# Patient Record
Sex: Male | Born: 1965
Health system: Southern US, Community
[De-identification: ages and names within clinical notes are randomized; demographics above are authoritative.]

## PROBLEM LIST (undated history)

## (undated) DIAGNOSIS — Z720 Tobacco use: Secondary | ICD-10-CM

## (undated) DIAGNOSIS — G44209 Tension-type headache, unspecified, not intractable: Secondary | ICD-10-CM

## (undated) DIAGNOSIS — I10 Essential (primary) hypertension: Secondary | ICD-10-CM

## (undated) DIAGNOSIS — E785 Hyperlipidemia, unspecified: Secondary | ICD-10-CM

## (undated) HISTORY — DX: Hyperlipidemia, unspecified: E78.5

## (undated) HISTORY — DX: Tension-type headache, unspecified, not intractable: G44.209

## (undated) HISTORY — DX: Tobacco use: Z72.0

---

## 1999-02-20 ENCOUNTER — Ambulatory Visit (HOSPITAL_COMMUNITY): Admission: RE | Admit: 1999-02-20 | Discharge: 1999-02-20 | Payer: Self-pay | Admitting: Family Medicine

## 1999-02-20 ENCOUNTER — Encounter: Payer: Self-pay | Admitting: Family Medicine

## 2001-12-07 ENCOUNTER — Other Ambulatory Visit: Admission: RE | Admit: 2001-12-07 | Discharge: 2001-12-07 | Payer: Self-pay | Admitting: Radiology

## 2008-12-26 ENCOUNTER — Encounter: Admission: RE | Admit: 2008-12-26 | Discharge: 2008-12-26 | Payer: Self-pay | Admitting: Family Medicine

## 2012-10-26 ENCOUNTER — Emergency Department (HOSPITAL_COMMUNITY)
Admission: EM | Admit: 2012-10-26 | Discharge: 2012-10-26 | Disposition: A | Discharge status: Home / Self Care | Payer: Managed Care, Other (non HMO) | Source: Home / Self Care | Attending: Emergency Medicine | Admitting: Emergency Medicine

## 2012-10-26 ENCOUNTER — Encounter (HOSPITAL_COMMUNITY): Payer: Self-pay | Admitting: Emergency Medicine

## 2012-10-26 DIAGNOSIS — K047 Periapical abscess without sinus: Secondary | ICD-10-CM

## 2012-10-26 MED ORDER — METRONIDAZOLE 500 MG PO TABS: 500.0000 mg | ORAL_TABLET | Freq: Three times a day (TID) | ORAL | Status: DC

## 2012-10-26 MED ORDER — PENICILLIN V POTASSIUM 500 MG PO TABS: 500.0000 mg | ORAL_TABLET | Freq: Four times a day (QID) | ORAL | Status: DC

## 2012-10-26 MED ORDER — HYDROCODONE-ACETAMINOPHEN 5-325 MG PO TABS: ORAL_TABLET | ORAL | Status: DC

## 2012-10-26 MED ORDER — MELOXICAM 15 MG PO TABS: 15.0000 mg | ORAL_TABLET | Freq: Every day | ORAL | Status: DC

## 2012-10-26 NOTE — ED Notes (Signed)
Pt is c/o of left facial pain that starts at cheek down to lower jaw line x 2 to 3 days.   Pt thinks that he has an abscess. Pt states that he has been some fatigue.  Denies any other symptom.  Using advil for pain relief.

## 2012-10-26 NOTE — ED Provider Notes (Signed)
Chief Complaint  Patient presents with  . Facial Pain    left facial pain x 2 days pt thinks he has an abscess. swelling starts at cheek to lower jaw.    History of Present Illness:   The patient is a 46 year old male who presents with a three-day history of left maxillary pain and swelling the pain seems to be arising from his first molar on the left side. He denies any injury. He's had some headaches and feels tired and rundown. He denies any fever, chills, sweats, ocular symptoms. He's had no nasal congestion, rhinorrhea, or earache. The pain is worse if she chews on that side but he has no difficulty with swallowing or breathing. He denies any swelling of the neck or pain and has had no coughing, wheezing, chest pain, or shortness of breath.  Review of Systems:  Other than noted above, the patient denies any of the following symptoms: Systemic:  No fever, chills, sweats or weight loss. ENT:  No headache, ear ache, sore throat, nasal congestion, facial pain, or swelling. Lymphatic:  No adenopathy. Lungs:  No coughing, wheezing or shortness of breath.  PMFSH:  Past medical history, family history, social history, meds, and allergies were reviewed.  Physical Exam:   Vital signs:  BP 126/92  Pulse 77  Temp 98.6 F (37 C) (Oral)  Resp 16  SpO2 99% General:  Alert, oriented, in no distress. ENT:  TMs and canals normal.  Nasal mucosa normal. Mouth exam:  He has multiple teeth that are missing, however the teeth that he does have all looked to be in good repair. The upper left first molar appears normal but it's tender to touch. There is no swelling of the gingiva. No collection of pus, the pharynx is clear, there is no swelling of the floor the mouth. Neck:  No swelling or adenopathy. Lungs:  Breath sounds clear and equal bilaterally.  No wheezes, rales or rhonchi. Heart:  Regular rhythm.  No gallops or murmers. Skin:  Clear, warm and dry.  Assessment:  The encounter diagnosis was Dental  abscess.  Plan:   1.  The following meds were prescribed:   New Prescriptions   HYDROCODONE-ACETAMINOPHEN (NORCO/VICODIN) 5-325 MG PER TABLET    1 to 2 tabs every 4 to 6 hours as needed for pain.   MELOXICAM (MOBIC) 15 MG TABLET    Take 1 tablet (15 mg total) by mouth daily.   METRONIDAZOLE (FLAGYL) 500 MG TABLET    Take 1 tablet (500 mg total) by mouth 3 (three) times daily.   PENICILLIN V POTASSIUM (VEETID) 500 MG TABLET    Take 1 tablet (500 mg total) by mouth 4 (four) times daily.   2.  The patient was instructed in symptomatic care and handouts were given. 3.  The patient was told to return if becoming worse in any way, if no better in 3 or 4 days, and given some red flag symptoms that would indicate earlier return, especially difficulty breathing. 4.  The patient was told to follow up with a dentist as soon as possible.    Reuben Likes, MD 10/26/12 1340

## 2015-04-04 ENCOUNTER — Emergency Department (HOSPITAL_COMMUNITY)
Admission: EM | Admit: 2015-04-04 | Discharge: 2015-04-04 | Disposition: A | Discharge status: Home / Self Care | Payer: BLUE CROSS/BLUE SHIELD | Source: Home / Self Care | Attending: Family Medicine | Admitting: Family Medicine

## 2015-04-04 ENCOUNTER — Encounter (HOSPITAL_COMMUNITY): Payer: Self-pay

## 2015-04-04 DIAGNOSIS — R42 Dizziness and giddiness: Secondary | ICD-10-CM

## 2015-04-04 DIAGNOSIS — I10 Essential (primary) hypertension: Secondary | ICD-10-CM | POA: Diagnosis not present

## 2015-04-04 LAB — POCT I-STAT, CHEM 8
BUN: 14 mg/dL (ref 6–23)
Calcium, Ion: 1.24 mmol/L — ABNORMAL HIGH (ref 1.12–1.23)
Chloride: 101 mmol/L (ref 96–112)
Creatinine, Ser: 1 mg/dL (ref 0.50–1.35)
Glucose, Bld: 129 mg/dL — ABNORMAL HIGH (ref 70–99)
HCT: 54 % — ABNORMAL HIGH (ref 39.0–52.0)
Hemoglobin: 18.4 g/dL — ABNORMAL HIGH (ref 13.0–17.0)
Potassium: 3.6 mmol/L (ref 3.5–5.1)
Sodium: 142 mmol/L (ref 135–145)
TCO2: 26 mmol/L (ref 0–100)

## 2015-04-04 MED ORDER — LISINOPRIL 10 MG PO TABS: 10.0000 mg | ORAL_TABLET | Freq: Every day | ORAL | Status: DC

## 2015-04-04 NOTE — Discharge Instructions (Signed)
Thank you for coming in today. Go to the emergency room if your headache becomes excruciating or you have weakness or numbness or uncontrolled vomiting.  Follow up with a primary doctor soon.  Start lisinopril daily for blood pressure.  Go to the ER if you get worse.   PRIMARY CARE Merchant navy officer at [employer] [address]  [place], [place] [phone]  Fax [phone]  Nature conservation officer at [facility] [address]  [place], [place] [phone]  Fax [phone]  Nature conservation officer at [place] / [place] [phone] [address] [place], [place] [phone]  Fax [phone]  [facility] [address]  [place], [place] [phone]  Fax [phone]  [facility] [address] [place] [address] [place], [place] [phone]  Fax [phone]  [facility] [address] [place], [place] [phone]  Fax [phone]   [facility] @ [place] [address] [place] [address] Phone: [phone]   [facility] @ [facility] [address] [place] [address] Phone: [phone]   [facility] Medicine @ [place] [address] [place] [address] Phone: [phone]   [facility] Medicine @ [place] [address] [place] [address] Phone: [phone]   [facility] @ [place] [address] [place] [address] Phone: [phone] Fax: [phone]   [facility]icians @ [place] [address] [place] [address] Phone: [phone]   Dr. Maryelizabeth Rowan [address] [place] [address] [phone]   Dizziness  Dizziness means you feel unsteady or lightheaded. You might feel like you are going to pass out (faint). HOME CARE    Drink enough fluids to keep your pee (urine) clear or pale yellow.  Take your medicines exactly as told by your doctor. If you take blood pressure medicine, always stand up slowly from the lying or sitting position. Hold on to something to steady yourself.  If you need to stand in one place for a long time, move your legs often. Tighten and relax your leg muscles.  Have someone stay with you until you feel okay.  Do not drive or use heavy machinery if you feel dizzy.  Do not drink alcohol. GET HELP RIGHT AWAY IF:   You feel dizzy or lightheaded and it gets worse.  You feel sick to your stomach (nauseous), or you throw up (vomit).  You have trouble talking or walking.  You feel weak or have trouble using your arms, hands, or legs.  You cannot think clearly or have trouble forming sentences.  You have chest pain, belly (abdominal) pain, sweating, or you are short of breath.  Your vision changes.  You are bleeding.  You have problems from your medicine that seem to be getting worse. MAKE SURE YOU:   Understand these instructions.  Will watch your condition.  Will get help right away if you are not doing well or get worse. Document Released: 12/02/2011 Document Revised: 03/06/2012 Document Reviewed: 12/02/2011 [facility] Patient Information 2015 [place], [place]. This information is not intended to replace advice given to you by your health care provider. Make sure you discuss any questions you have with your health care provider.  Hypertension Hypertension is  another name for high blood pressure. High blood pressure forces your heart to work harder to pump blood. A blood pressure reading has two numbers, which includes a higher number over a lower number (example: 110/72). HOME CARE   Have your blood pressure rechecked by your doctor.  Only take medicine as told by your doctor. Follow the directions carefully. The medicine does not work as well if you skip doses. Skipping  doses also puts you at risk for problems.  Do not smoke.  Monitor your blood pressure at home as told by your doctor. GET HELP IF:  You think you are having a reaction to the medicine you are taking.  You have repeat headaches or feel dizzy.  You have puffiness (swelling) in your ankles.  You have trouble with your vision. GET HELP RIGHT AWAY IF:   You get a very bad headache and are confused.  You feel weak, numb, or faint.  You get chest or belly (abdominal) pain.  You throw up (vomit).  You cannot breathe very well. MAKE SURE YOU:   Understand these instructions.  Will watch your condition.  Will get help right away if you are not doing well or get worse. Document Released: 05/31/2008 Document Revised: 12/18/2013 Document Reviewed: 10/05/2013 [facility] Patient Information 2015 [place]. This information is not intended to replace advice given to you by your health care provider. Make sure you discuss any questions you have with your health care provider.

## 2015-04-04 NOTE — ED Notes (Signed)
C/o 1 month duration of HA, dizzy . Pain better w tylenol

## 2015-04-04 NOTE — ED Provider Notes (Signed)
Zeb Katha Cabaldrong is a 49 y.o. male who presents to Urgent Care today for dizziness. Patient describes vertigo for the last month. The vertigo tends to be worse with head motion. He also notes a mild headache. He denies any vision changes weakness or numbness or falls. No fevers or chills vomiting or diarrhea. He's tried some Tylenol which helps some. He feels well otherwise.   History reviewed. No pertinent past medical history. History reviewed. No pertinent past surgical history. History  Substance Use Topics  . Smoking status: Current Every Day Smoker -- 1.00 packs/day    Types: Cigarettes  . Smokeless tobacco: Not on file  . Alcohol Use: Yes     Comment: occasional   ROS as above Medications: No current facility-administered medications for this encounter.   Current Outpatient Prescriptions  Medication Sig Dispense Refill  . HYDROcodone-acetaminophen (NORCO/VICODIN) 5-325 MG per tablet 1 to 2 tabs every 4 to 6 hours as needed for pain. 20 tablet 0  . meloxicam (MOBIC) 15 MG tablet Take 1 tablet (15 mg total) by mouth daily. 15 tablet 0  . metroNIDAZOLE (FLAGYL) 500 MG tablet Take 1 tablet (500 mg total) by mouth 3 (three) times daily. 30 tablet 0  . penicillin v potassium (VEETID) 500 MG tablet Take 1 tablet (500 mg total) by mouth 4 (four) times daily. 40 tablet 0   No Known Allergies   Exam:  BP 151/94 mmHg  Pulse 63  Temp(Src) 98.2 F (36.8 C) (Oral)  Resp 14  SpO2 98% Gen: Well NAD HEENT: EOMI,  MMM PERRLA Lungs: Normal work of breathing. CTABL Heart: RRR no MRG Abd: NABS, Soft. Nondistended, Nontender Exts: Brisk capillary refill, warm and well perfused.  Neuro: Alert and oriented cranial nerves II through XII are intact normal coordination reflexes sensation strength and balance and gait. Positive left sided [employer] Hallpike test.  Results for orders placed or performed during the hospital encounter of 04/04/15 (from the past 24 hour(s))  I-STAT, chem 8     Status:  Abnormal   Collection Time: 04/04/15 10:02 AM  Result Value Ref Range   Sodium 142 135 - 145 mmol/L   Potassium 3.6 3.5 - 5.1 mmol/L   Chloride 101 96 - 112 mmol/L   BUN 14 6 - 23 mg/dL   Creatinine, Ser 4.091.00 0.50 - 1.35 mg/dL   Glucose, Bld 811129 (H) 70 - 99 mg/dL   Calcium, Ion 9.141.24 (H) 1.12 - 1.23 mmol/L   TCO2 26 0 - 100 mmol/L   Hemoglobin 18.4 (H) 13.0 - 17.0 g/dL   HCT 78.254.0 (H) 95.639.0 - 21.352.0 %   No results found.  Assessment and Plan: 49 y.o. male with dizziness. Patient has vague vertigo symptoms. These have been ongoing but not worsening for the past month. He has a positive left-sided [employer] Hallpike test. I suspect he probably has BPPV. However he would require any further workup. He does not appear to be acutely ill. I recommend that he follow-up with a primary care provider for this further workup as needed.  Additionally he has elevated blood pressure. This does not appear to be controlled at all. Plan to treat with lisinopril and follow-up with her primary care provider again. I have provided an extensive list of primary care providers.  Discussed warning signs or symptoms. Please see discharge instructions. Patient expresses understanding.     Rodolph BongEvan S Corey, MD 04/04/15 1021

## 2016-03-26 ENCOUNTER — Emergency Department (INDEPENDENT_AMBULATORY_CARE_PROVIDER_SITE_OTHER)
Admission: EM | Admit: 2016-03-26 | Discharge: 2016-03-26 | Disposition: A | Discharge status: Home / Self Care | Payer: BLUE CROSS/BLUE SHIELD | Source: Home / Self Care | Attending: Family Medicine | Admitting: Family Medicine

## 2016-03-26 ENCOUNTER — Encounter (HOSPITAL_COMMUNITY): Payer: Self-pay | Admitting: Family Medicine

## 2016-03-26 DIAGNOSIS — I10 Essential (primary) hypertension: Secondary | ICD-10-CM

## 2016-03-26 DIAGNOSIS — L309 Dermatitis, unspecified: Secondary | ICD-10-CM

## 2016-03-26 DIAGNOSIS — H9313 Tinnitus, bilateral: Secondary | ICD-10-CM

## 2016-03-26 HISTORY — DX: Essential (primary) hypertension: I10

## 2016-03-26 MED ORDER — DEXAMETHASONE 4 MG PO TABS
10.0000 mg | ORAL_TABLET | Freq: Once | ORAL | Status: AC
  Administered 2016-03-26: 10 mg via ORAL

## 2016-03-26 MED ORDER — TRIAMCINOLONE ACETONIDE 0.025 % EX OINT: 1.0000 "application " | TOPICAL_OINTMENT | Freq: Two times a day (BID) | CUTANEOUS | Status: DC

## 2016-03-26 MED ORDER — DEXAMETHASONE 2 MG PO TABS
ORAL_TABLET | ORAL | Status: AC
  Filled 2016-03-26: qty 1

## 2016-03-26 MED ORDER — DEXAMETHASONE 4 MG PO TABS
ORAL_TABLET | ORAL | Status: AC
  Filled 2016-03-26: qty 2

## 2016-03-26 MED ORDER — FLUTICASONE PROPIONATE 50 MCG/ACT NA SUSP: 2.0000 | Freq: Every day | NASAL | Status: DC

## 2016-03-26 NOTE — ED Notes (Signed)
Pt  Reports  Symptoms  Of  A  Headache  As  Well as  Being  Dizzy  With  Onset   X  sev  Days      Pt  Has a  History    Of  Headache     About  1  Year  Ago

## 2016-03-26 NOTE — Discharge Instructions (Signed)
The Tinnitus, or ear ringing, is likely a natural process of aging or it may be due to eustachian tube dysfunction due to allergies. Please do the following: - start flonase every night - start a daily allergy pill like cetrizine - use the ointment as prescribed - use a fragrance free lotion such as Aveeno daily. - start ibuprofen 600 miligrams for headache   Please follow up with our social worker [phone]

## 2016-03-26 NOTE — ED Provider Notes (Signed)
CSN: [id number]     Arrival date & time 03/26/16  1634 History   First MD Initiated Contact with Patient 03/26/16 1820     Chief Complaint  Patient presents with  . Headache   (Consider location/radiation/quality/duration/timing/severity/associated sxs/prior Treatment) HPI  Ringing in ears: L>R. Ongoing 2-3 years ago. Denies dizzines sand HA, LOC. Nothing makes it better. Nothing makes it worse. No specific triggers. Not associated with chest pain or palpitations or high blood pressure. Patient does endorse having allergies on a regular basis which include sinus congestion..   Itching legs. Worse in the cold. Now skin turning dark. OTC cream w/o benefit.       Past Medical History  Diagnosis Date  . Hypertension    History reviewed. No pertinent past surgical history. Family History  Problem Relation Age of Onset  . Family history unknown: Yes   Social History  Substance Use Topics  . Smoking status: Current Every Day Smoker -- 1.00 packs/day    Types: Cigarettes  . Smokeless tobacco: None  . Alcohol Use: Yes     Comment: occasional    Review of Systems Per HPI with all other pertinent systems negative.   Allergies  Review of patient's allergies indicates no known allergies.  Home Medications   Prior to Admission medications   Medication Sig Start Date End Date Taking? Authorizing Provider  fluticasone (FLONASE) 50 MCG/ACT nasal spray Place 2 sprays into both nostrils at bedtime. 03/26/16   Ozella Rocksavid J Merrell, MD  lisinopril (PRINIVIL,ZESTRIL) 10 MG tablet Take 1 tablet (10 mg total) by mouth daily. 04/04/15   Rodolph BongEvan S Corey, MD  triamcinolone (KENALOG) 0.025 % ointment Apply 1 application topically 2 (two) times daily. Stop after 1 week. May repeat in 2 weeks 03/26/16   Ozella Rocksavid J Merrell, MD   Meds Ordered and Administered this Visit   Medications  dexamethasone (DECADRON) tablet 10 mg (not administered)    BP 124/85 mmHg  Pulse 74  Temp(Src) 98.2 F (36.8 C) (Oral)   Resp 14  SpO2 99% No data found.   Physical Exam Physical Exam  Constitutional: oriented to person, place, and time. appears well-developed and well-nourished. No distress.  HENT:  TMs normal bilaterally Head: Normocephalic and atraumatic.  Eyes: EOMI. PERRL.  Neck: Normal range of motion.  Cardiovascular: RRR, no m/r/g, 2+ distal pulses,  Pulmonary/Chest: Effort normal and breath sounds normal. No respiratory distress.  Abdominal: Soft. Bowel sounds are normal. NonTTP, no distension.  Musculoskeletal: Normal range of motion. Non ttp, no effusion.  Neurological: alert and oriented to person, place, and time.  Skin: Various areas of dark scaly skin over legs and into cubital fossa was.  Psychiatric: normal mood and affect. behavior is normal. Judgment and thought content normal.   ED Course  Procedures (including critical care time)  Labs Review Labs Reviewed - No data to display  Imaging Review No results found.   Visual Acuity Review  Right Eye Distance:   Left Eye Distance:   Bilateral Distance:    Right Eye Near:   Left Eye Near:    Bilateral Near:         MDM   1. Tinnitus aurium, bilateral   2. Eczema   3. Essential hypertension    Patient started on lisinopril due to hypertension. Patient to follow-up with primary care physician. Patient no longer hypertensive at this time. No need to refill lisinopril.  Tinnitus likely from age-related degenerative changes in the middle ear versus eustachian tube dysfunction from  chronic allergies. Decadron 10 mg by mouth given. Patient start Flonase. NSAID to be used emesis make symptoms worse in which case he should stop. Follow-up with PCP.  Patient with eczematous type changes on his legs. This seems to be seasonal with worsening in the winters. Patient to start Kenalog ointment as well as topical hypoallergenic lotions. He should be aware of risks of using this ointment as a contemporary lighting and permanently thin  the skin.    Ozella Rocks, MD 03/26/16 [phone]

## 2018-05-17 ENCOUNTER — Encounter (HOSPITAL_COMMUNITY): Payer: Self-pay | Admitting: Emergency Medicine

## 2018-05-17 ENCOUNTER — Ambulatory Visit (HOSPITAL_COMMUNITY)
Admission: EM | Admit: 2018-05-17 | Discharge: 2018-05-17 | Disposition: A | Discharge status: Home / Self Care | Payer: BLUE CROSS/BLUE SHIELD | Attending: Family Medicine | Admitting: Family Medicine

## 2018-05-17 DIAGNOSIS — M62838 Other muscle spasm: Secondary | ICD-10-CM | POA: Diagnosis not present

## 2018-05-17 MED ORDER — CYCLOBENZAPRINE HCL 10 MG PO TABS: 10.0000 mg | ORAL_TABLET | Freq: Every day | ORAL | 0 refills | Status: DC

## 2018-05-17 MED ORDER — DICLOFENAC SODIUM 75 MG PO TBEC: 75.0000 mg | DELAYED_RELEASE_TABLET | Freq: Two times a day (BID) | ORAL | 0 refills | Status: DC

## 2018-05-17 NOTE — ED Triage Notes (Signed)
Pt c/o L shoulder pain, started last night. Painful with moving.

## 2018-05-24 NOTE — ED Provider Notes (Signed)
[facility]   [id number] 05/17/18 Arrival Time: 1305  ASSESSMENT & PLAN:  1. Muscle spasm    Meds ordered this encounter  Medications  . cyclobenzaprine (FLEXERIL) 10 MG tablet    Sig: Take 1 tablet (10 mg total) by mouth at bedtime. As needed for muscle spasm.    Dispense:  10 tablet    Refill:  0  . diclofenac (VOLTAREN) 75 MG EC tablet    Sig: Take 1 tablet (75 mg total) by mouth 2 (two) times daily.    Dispense:  14 tablet    Refill:  0   Medication sedation precautions. Encouraged ROM as tolerated. Follow-up Information    [facility].   Specialty:  Urgent Care Why:  If symptoms worsen. Contact information: [address] [place] [address] [phone]         Reviewed expectations re: course of current medical issues. Questions answered. Outlined signs and symptoms indicating need for more acute intervention. Patient verbalized understanding. After Visit Summary given.  SUBJECTIVE: History from: patient. David Shepherd is a 52 y.o. male who reports intermittent mild pain of his left shoulder and upper back discomfort that is stable; described as aching and cramping without radiation. Onset: gradual, 1-2 days ago; noticed more last evening Injury/trama: no. Relieved by: rest. Worsened by: certain movements. Associated symptoms: none reported. Extremity sensation changes or weakness: none. Self treatment: has not tried OTCs for relief of pain. History of similar: no  ROS: As per HPI.   OBJECTIVE:  Vitals:   05/17/18 1346  BP: 137/79  Pulse: 66  Resp: 18  Temp: 97.6 F (36.4 C)  SpO2: 100%    General appearance: alert; no distress Extremities: warm and well perfused; symmetrical with no gross deformities; poorly localized tenderness over his left shoulder area along trapezius distribution with no swelling and no bruising; ROM: normal but with reported discomfort CV: normal extremity  capillary refill Skin: warm and dry Neurologic: normal gait; normal symmetric reflexes in all extremities; normal sensation in all extremities Psychological: alert and cooperative; normal mood and affect  No Known Allergies  Past Medical History:  Diagnosis Date  . Hypertension    Social History   Socioeconomic History  . Marital status: Single    Spouse name: Not on file  . Number of children: Not on file  . Years of education: Not on file  . Highest education level: Not on file  Occupational History  . Not on file  Social Needs  . Financial resource strain: Not on file  . Food insecurity:    Worry: Not on file    Inability: Not on file  . Transportation needs:    Medical: Not on file    Non-medical: Not on file  Tobacco Use  . Smoking status: Current Every Day Smoker    Packs/day: 1.00    Types: Cigarettes  Substance and Sexual Activity  . Alcohol use: Yes    Comment: occasional  . Drug use: Not on file  . Sexual activity: Not on file  Lifestyle  . Physical activity:    Days per week: Not on file    Minutes per session: Not on file  . Stress: Not on file  Relationships  . Social connections:    Talks on phone: Not on file    Gets together: Not on file    Attends religious service: Not on file    Active member of club or organization: Not  on file    Attends meetings of clubs or organizations: Not on file    Relationship status: Not on file  . Intimate partner violence:    Fear of current or ex partner: Not on file    Emotionally abused: Not on file    Physically abused: Not on file    Forced sexual activity: Not on file  Other Topics Concern  . Not on file  Social History Narrative  . Not on file   Family History  Family history unknown: Yes   History reviewed. No pertinent surgical history.    Mardella Layman, MD 05/29/18 [phone]

## 2018-05-25 ENCOUNTER — Emergency Department (HOSPITAL_COMMUNITY): Payer: No Typology Code available for payment source

## 2018-05-25 ENCOUNTER — Other Ambulatory Visit: Payer: Self-pay

## 2018-05-25 ENCOUNTER — Encounter (HOSPITAL_COMMUNITY): Payer: Self-pay | Admitting: Emergency Medicine

## 2018-05-25 ENCOUNTER — Emergency Department (HOSPITAL_COMMUNITY)
Admission: EM | Admit: 2018-05-25 | Discharge: 2018-05-25 | Disposition: A | Discharge status: Home / Self Care | Payer: No Typology Code available for payment source | Attending: Emergency Medicine | Admitting: Emergency Medicine

## 2018-05-25 DIAGNOSIS — M25512 Pain in left shoulder: Secondary | ICD-10-CM | POA: Insufficient documentation

## 2018-05-25 DIAGNOSIS — M542 Cervicalgia: Secondary | ICD-10-CM

## 2018-05-25 DIAGNOSIS — M5412 Radiculopathy, cervical region: Secondary | ICD-10-CM | POA: Diagnosis not present

## 2018-05-25 DIAGNOSIS — F17210 Nicotine dependence, cigarettes, uncomplicated: Secondary | ICD-10-CM | POA: Insufficient documentation

## 2018-05-25 DIAGNOSIS — I10 Essential (primary) hypertension: Secondary | ICD-10-CM | POA: Insufficient documentation

## 2018-05-25 MED ORDER — KETOROLAC TROMETHAMINE 30 MG/ML IJ SOLN
30.0000 mg | Freq: Once | INTRAMUSCULAR | Status: AC
  Administered 2018-05-25: 30 mg via INTRAVENOUS
  Filled 2018-05-25: qty 1

## 2018-05-25 MED ORDER — OXYCODONE-ACETAMINOPHEN 5-325 MG PO TABS
1.0000 | ORAL_TABLET | Freq: Once | ORAL | Status: AC
  Administered 2018-05-25: 1 via ORAL
  Filled 2018-05-25: qty 1

## 2018-05-25 MED ORDER — PREDNISONE 50 MG PO TABS: 50.0000 mg | ORAL_TABLET | Freq: Every day | ORAL | 0 refills | Status: AC

## 2018-05-25 MED ORDER — TRAMADOL HCL 50 MG PO TABS: 50.0000 mg | ORAL_TABLET | Freq: Four times a day (QID) | ORAL | 0 refills | Status: DC | PRN

## 2018-05-25 MED ORDER — METHOCARBAMOL 500 MG PO TABS
1000.0000 mg | ORAL_TABLET | Freq: Once | ORAL | Status: AC
  Administered 2018-05-25: 1000 mg via ORAL
  Filled 2018-05-25: qty 2

## 2018-05-25 MED ORDER — PREDNISONE 20 MG PO TABS
60.0000 mg | ORAL_TABLET | Freq: Once | ORAL | Status: AC
  Administered 2018-05-25: 60 mg via ORAL
  Filled 2018-05-25: qty 3

## 2018-05-25 NOTE — ED Notes (Signed)
Patient transported to X-ray 

## 2018-05-25 NOTE — ED Provider Notes (Signed)
[facility] EMERGENCY DEPARTMENT Provider Note   CSN: [id number] Arrival date & time: 05/25/18  4098     History   Chief Complaint Chief Complaint  Patient presents with  . Shoulder Pain    HPI David Shepherd is a 52 y.o. male.  David Shepherd is a 52 y.o. Male with a history of hypertension, who presents to the ED for evaluation of pain radiating from his neck and left shoulder down through his left arm.  He reports pain started on 5/21 after coming home from work.  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  Patient reports the next day pain was worse and he was seen at urgent care, he was diagnosed with muscle spasm, and treated with diclofenac and Flexeril but patient reports pain has continued to worsen.  He now feels some intermittent tingling and weakness in his left arm.  If he is moving the left arm without difficulty here in the ED.  No redness or swelling over the shoulder.  No fevers or chills.  He denies any new injury, has been continuing to go to work.  He reports pain is a constant dull ache and feels like his muscles are not.  He denies any chest pain or shortness of breath.  Pain is not worse with a exertion or deep breathing.  Patient speaks [place] [employer] interpreter services used, slight difference in dialect, but wife assisted in translation.     Past Medical History:  Diagnosis Date  . Hypertension     There are no active problems to display for this patient.   History reviewed. No pertinent surgical history.      Home Medications    Prior to Admission medications   Medication Sig Start Date End Date Taking? Authorizing Provider  cyclobenzaprine (FLEXERIL) 10 MG tablet Take 1 tablet (10 mg total) by mouth at bedtime. As needed for muscle spasm. 05/17/18   Mardella Layman, MD  diclofenac (VOLTAREN) 75 MG EC tablet Take 1 tablet (75 mg total) by mouth 2 (two) times daily. 05/17/18   Mardella Layman, MD    Family History Family History  Family history unknown: Yes    Social History Social History   Tobacco Use  . Smoking status: Current Every Day Smoker    Packs/day: 1.00    Types: Cigarettes  Substance Use Topics  . Alcohol use: Yes    Comment: occasional  . Drug use: Not on file     Allergies   Patient has no known allergies.   Review of Systems Review of Systems  Constitutional: Negative for chills and fever.  Respiratory: Negative for cough and shortness of breath.   Cardiovascular: Negative for chest pain and leg swelling.  Gastrointestinal: Negative for nausea and vomiting.  Musculoskeletal: Positive for arthralgias. Negative for joint swelling.  Skin: Negative for color change, rash and wound.  Neurological: Positive for weakness. Negative for dizziness, tremors, syncope, light-headedness and numbness.     Physical Exam Updated Vital Signs BP 138/89 (BP Location: Right Arm)   Pulse 90   Temp 97.7 F (36.5 C) (Oral)   Resp 18   Ht  (1.549 m)   Wt 56.2 kg (124 lb)   SpO2 100%   BMI 23.43 kg/m   Physical Exam  Constitutional: He appears well-developed and well-nourished. No distress.  HENT:  Head: Normocephalic and atraumatic.  Eyes: Right eye exhibits no discharge. Left eye exhibits no discharge.  Neck: Normal range of motion. Neck supple.  There is some tenderness primarily over the left paraspinal muscles of the C-spine, no overlying erythema or palpable deformity  Pulmonary/Chest: Effort normal. No respiratory distress.  Musculoskeletal:  Tenderness to palpation over the distribution of the left trapezius muscle, and pain over the left shoulder, primarily over the posterior aspect, no overlying erythema or appreciable swelling, no obvious deformity, able to move the shoulder through full passive range of motion with some discomfort, normal range of motion at the elbow and wrist without pain, no swelling or erythema over the upper or  lower arm, 2+ radial pulse and good capillary refill, cardinal hand movements intact, 5/5 grip strength, sensation intact  Neurological: He is alert. Coordination normal.  Skin: Skin is warm and dry. Capillary refill takes less than 2 seconds. He is not diaphoretic.  Psychiatric: He has a normal mood and affect. His behavior is normal.  Nursing note and vitals reviewed.    ED Treatments / Results  Labs (all labs ordered are listed, but only abnormal results are displayed) Labs Reviewed - No data to display  EKG EKG Interpretation  Date/Time:  Thursday May 25 2018 09:08:44 EDT Ventricular Rate:  92 PR Interval:  158 QRS Duration: 90 QT Interval:  352 QTC Calculation: 435 R Axis:   80 Text Interpretation:  Normal sinus rhythm Normal ECG no prior ECG for comparison.  no STEMI Confirmed by Theda Belfast ([id number]) on 05/25/2018 11:12:06 AM   Radiology Dg Cervical Spine Complete  Result Date: 05/25/2018 CLINICAL DATA:  Pain for several days, initial encounter EXAM: CERVICAL SPINE - COMPLETE 4+ VIEW COMPARISON:  None. FINDINGS: Seven cervical segments are well visualized. Loss of the normal cervical lordosis is noted likely related to muscular spasm. Mild neural foraminal narrowing is noted bilaterally at C3-4 and C4-5. No acute fracture or acute facet abnormality is noted. The odontoid is within normal limits. No soft tissue changes are seen. IMPRESSION: Mild degenerative change as described. Loss of the normal cervical lordosis which may be related to muscular spasm. Electronically Signed   By: Alcide Clever M.D.   On: 05/25/2018 10:36   Dg Shoulder Left  Result Date: 05/25/2018 CLINICAL DATA:  Left shoulder pain EXAM: LEFT SHOULDER - 2+ VIEW COMPARISON:  None. FINDINGS: There is no evidence of fracture or dislocation. There is no evidence of arthropathy or other focal bone abnormality. Soft tissues are unremarkable. IMPRESSION: Negative. Electronically Signed   By: Charlett Nose M.D.   On:  05/25/2018 10:36    Procedures Procedures (including critical care time)  Medications Ordered in ED Medications  ketorolac (TORADOL) 30 MG/ML injection 30 mg (30 mg Intravenous Given 05/25/18 1135)  methocarbamol (ROBAXIN) tablet 1,000 mg (1,000 mg Oral Given 05/25/18 1135)  predniSONE (DELTASONE) tablet 60 mg (60 mg Oral Given 05/25/18 1135)  oxyCODONE-acetaminophen (PERCOCET/ROXICET) 5-325 MG per tablet 1 tablet (1 tablet Oral Given 05/25/18 1136)    Initial Impression / Assessment and Plan / ED Course  I have reviewed the triage vital signs and the nursing notes.  Pertinent labs & imaging results that were available during my care of the patient were reviewed by me and considered in my medical decision making (see chart for details).  Patient presents for evaluation of persistent pain over the left neck and shoulder.  Seen at urgent care, prescribed Flexeril and diclofenac, but symptoms not improving.  Presentation is very much consistent with cervical radiculopathy, will get left shoulder and cervical films.  Patient is  neurovascularly intact without any right-sided symptoms.  Will give Toradol, prednisone, Robaxin and Percocet for pain.  Given that symptoms are left-sided will get EKG although patient denies any chest pain or shortness of breath and this pain has been occurring for greater than 1 week so very unlikely cardiac.  Negative shoulder x-ray.  Cervical x-ray does show loss of normal cervical lordosis which is not surprising as patient has obvious muscle spasm on exam, there are some foraminal degenerative changes which could be causing patient's radicular symptoms.  EKG shows normal sinus without concerning changes.  We will plan to treat with course of steroids in addition to medications prescribed by urgent care, tramadol given for breakthrough pain, also recommended over-the-counter muscle rubs and patches.  Patient to follow-up with primary care, if symptoms are not improving in  1 week, or neurosurgery referral provided.  Return precautions discussed.  Patient expresses understanding and is in agreement with plan.  [employer] interpreter services used to go over discharge information in detail.  Final Clinical Impressions(s) / ED Diagnoses   Final diagnoses:  Cervical radiculopathy  Acute pain of left shoulder  Neck pain    ED Discharge Orders        Ordered    predniSONE (DELTASONE) 50 MG tablet  Daily     05/25/18 1143    traMADol (ULTRAM) 50 MG tablet  Every 6 hours PRN     05/25/18 1143       Dartha Lodge, PA-C 05/25/18 1149    Tegeler, Canary Brim, MD 05/25/18 1540

## 2018-05-25 NOTE — Discharge Instructions (Signed)
Your pain is likely due to a cervical radiculopathy or a pinched or irritated nerve coming from the right side of your neck.  The x-ray of your neck does show some degenerative changes that could be causing this.  Your shoulder x-ray looks good.  Please continue with the Flexeril and diclofenac prescribed to you by urgent care, I would also like for you to take the 5-day course of steroids as prescribed.  If you are continuing to have pain you may use tramadol every 6 hours as needed, these medications can cause some drowsiness, so do not drive or operate machinery, do not combine with alcohol.  In addition to this you can use topical muscle rubs like Biofreeze or lidocaine patches like salon poss which can be purchased over-the-counter.  Please follow-up with primary care, you may follow-up with the [facility] or use the phone number provided to establish a regular doctor.  If after 1 week pain is still persisting I would like for you to follow-up with the neurosurgery clinic provided.

## 2018-05-25 NOTE — ED Notes (Signed)
Jodi Geralds, PA gives discharge instructions using [place] interpreter

## 2018-05-25 NOTE — ED Triage Notes (Signed)
Pt arrives via POV from home with left arm pain radiating to his back for the last week. Denies chest pain/recent injury. Denies SOB. VSS. Skin warm, dry.

## 2018-06-13 ENCOUNTER — Ambulatory Visit: Payer: No Typology Code available for payment source | Attending: Nurse Practitioner | Admitting: Nurse Practitioner

## 2018-06-13 ENCOUNTER — Encounter: Payer: Self-pay | Admitting: Nurse Practitioner

## 2018-06-13 VITALS — BP 129/89 | HR 83 | Temp 98.4°F | Ht 60.0 in | Wt 123.6 lb

## 2018-06-13 DIAGNOSIS — M5412 Radiculopathy, cervical region: Secondary | ICD-10-CM | POA: Diagnosis not present

## 2018-06-13 DIAGNOSIS — I10 Essential (primary) hypertension: Secondary | ICD-10-CM | POA: Insufficient documentation

## 2018-06-13 DIAGNOSIS — Z0000 Encounter for general adult medical examination without abnormal findings: Secondary | ICD-10-CM | POA: Diagnosis not present

## 2018-06-13 DIAGNOSIS — M542 Cervicalgia: Secondary | ICD-10-CM | POA: Diagnosis not present

## 2018-06-13 MED ORDER — TRAMADOL HCL 50 MG PO TABS: 50.0000 mg | ORAL_TABLET | Freq: Four times a day (QID) | ORAL | 0 refills | Status: AC | PRN

## 2018-06-13 MED ORDER — DICLOFENAC SODIUM 75 MG PO TBEC: 75.0000 mg | DELAYED_RELEASE_TABLET | Freq: Two times a day (BID) | ORAL | 1 refills | Status: AC

## 2018-06-13 MED ORDER — CYCLOBENZAPRINE HCL 10 MG PO TABS: 10.0000 mg | ORAL_TABLET | Freq: Three times a day (TID) | ORAL | 1 refills | Status: AC

## 2018-06-13 NOTE — Patient Instructions (Addendum)
[facility] Address: [address], [place], [place] [address] Phone: [phone]

## 2018-06-13 NOTE — Progress Notes (Signed)
Assessment & Plan:  David Shepherd was seen today for new patient (initial visit).  Diagnoses and all orders for this visit:  Cervical radiculopathy -     cyclobenzaprine (FLEXERIL) 10 MG tablet; Take 1 tablet (10 mg total) by mouth 3 (three) times daily. As needed for muscle spasm. -     diclofenac (VOLTAREN) 75 MG EC tablet; Take 1 tablet (75 mg total) by mouth 2 (two) times daily. -     traMADol (ULTRAM) 50 MG tablet; Take 1 tablet (50 mg total) by mouth every 6 (six) hours as needed for severe pain. -     Ambulatory referral to Neurosurgery  Cervical pain (neck) -     cyclobenzaprine (FLEXERIL) 10 MG tablet; Take 1 tablet (10 mg total) by mouth 3 (three) times daily. As needed for muscle spasm. -     diclofenac (VOLTAREN) 75 MG EC tablet; Take 1 tablet (75 mg total) by mouth 2 (two) times daily. -     traMADol (ULTRAM) 50 MG tablet; Take 1 tablet (50 mg total) by mouth every 6 (six) hours as needed for severe pain. -     Ambulatory referral to Neurosurgery  Routine health maintenance -     CBC -     Basic metabolic panel -     Lipid panel    Patient has been counseled on age-appropriate routine health concerns for screening and prevention. These are reviewed and up-to-date. Referrals have been placed accordingly. Immunizations are up-to-date or declined.    Subjective:   Chief Complaint  Patient presents with  . New Patient (Initial Visit)    Pt. is here for new patient and hospital follow-up. Pt. stated he still have arm pain and pain on the back of his head.     HPI Bosco Dayhoff 52 y.o. male presents to office today to establish care. VRI was used to communicate directly with patient for the entire encounter including providing detailed patient instructions. Primary language is [place]. Will need an onsite interpreter at next office visit.    NECK PAIN He was seen in the ED on 05-25-2018 with complaints of radiating neck and left shoulder/arm pain. His job involves heavy manual  labor however he can not recall any specific injury. He had been seen at an urgent care a day prior and prescribed flexeril and diclofenac with no relief of symptoms. He was given prednisone and tramadol for pain upon discharge on 05-25-2018. Today he reports little relief from those medications as well. He continues to endorse numbness, tingling and burning in his left arm and sharp shooting pain as well as burning sensation in his posterior neck and left shoulder.   Cervical Xray from ED showing some foraminal degenerative changes which could be causing the radiculopathy. Patient was instructed if pain continued to follow up with neurosurgery. Unfortunately he thought he was being seen today here in this office by neurosurgery. I have given him the number to call for Neurosurgery on his AVS.   Essential Hypertension Currently not on any antihypertensives. He denies any history of HTN.  Denies chest pain, shortness of breath, palpitations, lightheadedness, dizziness, headaches or BLE edema.  BP Readings from Last 3 Encounters:  06/13/18 129/89  05/25/18 138/89  05/17/18 137/79   Review of Systems  Constitutional: Negative for fever, malaise/fatigue and weight loss.  HENT: Negative.  Negative for nosebleeds.   Eyes: Negative.  Negative for blurred vision, double vision and photophobia.  Respiratory: Negative.  Negative for cough and shortness  of breath.   Cardiovascular: Negative.  Negative for chest pain, palpitations and leg swelling.  Gastrointestinal: Negative.  Negative for heartburn, nausea and vomiting.  Musculoskeletal: Positive for neck pain. Negative for back pain, falls, joint pain and myalgias.  Neurological: Positive for tingling and sensory change. Negative for dizziness, speech change, focal weakness, seizures and headaches.  Psychiatric/Behavioral: Negative.  Negative for suicidal ideas.    Past Medical History:  Diagnosis Date  . Hypertension     History reviewed. No  pertinent surgical history.  Family History  Family history unknown: Yes    Social History Reviewed with no changes to be made today.   Outpatient Medications Prior to Visit  Medication Sig Dispense Refill  . cyclobenzaprine (FLEXERIL) 10 MG tablet Take 1 tablet (10 mg total) by mouth at bedtime. As needed for muscle spasm. 10 tablet 0  . diclofenac (VOLTAREN) 75 MG EC tablet Take 1 tablet (75 mg total) by mouth 2 (two) times daily. 14 tablet 0  . traMADol (ULTRAM) 50 MG tablet Take 1 tablet (50 mg total) by mouth every 6 (six) hours as needed. (Patient not taking: Reported on 06/13/2018) 10 tablet 0   No facility-administered medications prior to visit.     No Known Allergies     Objective:    BP 129/89 (BP Location: Right Arm, Patient Position: Sitting, Cuff Size: Normal)   Pulse 83   Temp 98.4 F (36.9 C) (Oral)   Ht 5' (1.524 m)   Wt 123 lb 9.6 oz (56.1 kg)   SpO2 99%   BMI 24.14 kg/m  Wt Readings from Last 3 Encounters:  06/13/18 123 lb 9.6 oz (56.1 kg)  05/25/18 124 lb (56.2 kg)    Physical Exam  Constitutional: He is oriented to person, place, and time. He appears well-developed and well-nourished. He is cooperative.  HENT:  Head: Normocephalic and atraumatic.  Eyes: EOM are normal.  Neck: Normal range of motion.  Cardiovascular: Normal rate, regular rhythm, normal heart sounds and intact distal pulses. Exam reveals no gallop and no friction rub.  No murmur heard. Pulmonary/Chest: Effort normal and breath sounds normal. No tachypnea. No respiratory distress. He has no decreased breath sounds. He has no wheezes. He has no rhonchi. He has no rales. He exhibits no tenderness.  Abdominal: Bowel sounds are normal.  Musculoskeletal: Normal range of motion. He exhibits tenderness. He exhibits no edema or deformity.       Left shoulder: He exhibits pain (with firm palpation of trapezius muscle).       Left elbow: He exhibits normal range of motion, no swelling, no  deformity and no laceration.       Left wrist: He exhibits normal range of motion, no tenderness and no swelling.       Cervical back: He exhibits pain (with active hyperextension and hyperflexion of neck ).  Neurological: He is alert and oriented to person, place, and time. No cranial nerve deficit. Coordination normal.  Skin: Skin is warm and dry.  Psychiatric: He has a normal mood and affect. His behavior is normal. Judgment and thought content normal.  Nursing note and vitals reviewed.        Patient has been counseled extensively about nutrition and exercise as well as the importance of adherence with medications and regular follow-up. The patient was given clear instructions to go to ER or return to medical center if symptoms don't improve, worsen or new problems develop. The patient verbalized understanding.   Follow-up: Return in  about 8 weeks (around 08/08/2018) for cervical neck .   Claiborne Rigg, FNP-BC [facility] [place], [place] [phone]   06/13/2018, 5:58 PM

## 2018-06-14 LAB — BASIC METABOLIC PANEL
BUN/Creatinine Ratio: 18 (ref 9–20)
BUN: 15 mg/dL (ref 6–24)
CHLORIDE: 105 mmol/L (ref 96–106)
CO2: 22 mmol/L (ref 20–29)
Calcium: 9.3 mg/dL (ref 8.7–10.2)
Creatinine, Ser: 0.84 mg/dL (ref 0.76–1.27)
GFR calc Af Amer: 116 mL/min/{1.73_m2} (ref >59)
GFR calc non Af Amer: 101 mL/min/{1.73_m2} (ref >59)
GLUCOSE: 70 mg/dL (ref 65–99)
POTASSIUM: 4.4 mmol/L (ref 3.5–5.2)
Sodium: 141 mmol/L (ref 134–144)

## 2018-06-14 LAB — LIPID PANEL
CHOLESTEROL TOTAL: 184 mg/dL (ref 100–199)
Chol/HDL Ratio: 5.8 ratio — ABNORMAL HIGH (ref 0.0–5.0)
HDL: 32 mg/dL — ABNORMAL LOW (ref >39)
LDL Calculated: 108 mg/dL — ABNORMAL HIGH (ref 0–99)
Triglycerides: 219 mg/dL — ABNORMAL HIGH (ref 0–149)
VLDL Cholesterol Cal: 44 mg/dL — ABNORMAL HIGH (ref 5–40)

## 2018-06-14 LAB — CBC
Hematocrit: 46.6 % (ref 37.5–51.0)
Hemoglobin: 15.3 g/dL (ref 13.0–17.7)
MCH: 24.1 pg — AB (ref 26.6–33.0)
MCHC: 32.8 g/dL (ref 31.5–35.7)
MCV: 74 fL — ABNORMAL LOW (ref 79–97)
PLATELETS: 209 10*3/uL (ref 150–450)
RBC: 6.34 x10E6/uL — ABNORMAL HIGH (ref 4.14–5.80)
RDW: 15.6 % — AB (ref 12.3–15.4)
WBC: 10.7 10*3/uL (ref 3.4–10.8)

## 2018-06-15 ENCOUNTER — Telehealth: Payer: Self-pay

## 2018-06-15 NOTE — Telephone Encounter (Signed)
CMA attempt to call patient to inform on lab results.  No answer and unable to leave a VM.  A letter will be send out to patient.   If patient call back, please inform:  Labs are essentially normal asie from your cholesterol levels. Make sure you are drinking at least 48 oz of water per day. Work on eating a low fat, heart healthy diet and participate in regular aerobic exercise program to control as well. Exercise at least 150 minutes per week.  No fried foods. No junk foods, sodas, sugary drinks, unhealthy snacking, alcohol or smoking.

## 2018-06-15 NOTE — Telephone Encounter (Signed)
-----   Message from Claiborne RiggZelda W Fleming, NP sent at 06/14/2018  7:00 PM EDT ----- Labs are essentially normal asie from your cholesterol levels. Make sure you are drinking at least 48 oz of water per day. Work on eating a low fat, heart healthy diet and participate in regular aerobic exercise program to control as well. Exercise at least 150 minutes per week.  No fried foods. No junk foods, sodas, sugary drinks, unhealthy snacking, alcohol or smoking.

## 2018-06-28 ENCOUNTER — Telehealth: Payer: Self-pay | Admitting: Nurse Practitioner

## 2018-06-28 NOTE — Telephone Encounter (Signed)
Patient niece returned call and was informed of lab results. Patient had no questions.

## 2018-08-04 ENCOUNTER — Ambulatory Visit: Payer: BLUE CROSS/BLUE SHIELD | Attending: Nurse Practitioner | Admitting: Nurse Practitioner

## 2018-11-26 HISTORY — PX: NO PAST SURGERIES: SHX2092

## 2018-12-07 ENCOUNTER — Encounter: Payer: Self-pay | Admitting: Medical

## 2018-12-22 ENCOUNTER — Ambulatory Visit (INDEPENDENT_AMBULATORY_CARE_PROVIDER_SITE_OTHER): Payer: BLUE CROSS/BLUE SHIELD | Admitting: Medical

## 2018-12-22 ENCOUNTER — Encounter: Payer: Self-pay | Admitting: Medical

## 2018-12-22 VITALS — BP 128/80 | HR 73 | Temp 97.5°F | Resp 16 | Ht 60.0 in | Wt 128.4 lb

## 2018-12-22 DIAGNOSIS — H538 Other visual disturbances: Secondary | ICD-10-CM

## 2018-12-22 DIAGNOSIS — H9313 Tinnitus, bilateral: Secondary | ICD-10-CM

## 2018-12-22 DIAGNOSIS — Z720 Tobacco use: Secondary | ICD-10-CM | POA: Diagnosis not present

## 2018-12-22 DIAGNOSIS — Z125 Encounter for screening for malignant neoplasm of prostate: Secondary | ICD-10-CM

## 2018-12-22 DIAGNOSIS — Z0000 Encounter for general adult medical examination without abnormal findings: Secondary | ICD-10-CM

## 2018-12-22 DIAGNOSIS — Z1211 Encounter for screening for malignant neoplasm of colon: Secondary | ICD-10-CM | POA: Diagnosis not present

## 2018-12-22 DIAGNOSIS — R0600 Dyspnea, unspecified: Secondary | ICD-10-CM

## 2018-12-22 DIAGNOSIS — R42 Dizziness and giddiness: Secondary | ICD-10-CM

## 2018-12-22 DIAGNOSIS — R0989 Other specified symptoms and signs involving the circulatory and respiratory systems: Secondary | ICD-10-CM

## 2018-12-22 DIAGNOSIS — K5900 Constipation, unspecified: Secondary | ICD-10-CM

## 2018-12-22 DIAGNOSIS — H9193 Unspecified hearing loss, bilateral: Secondary | ICD-10-CM

## 2018-12-22 LAB — POCT URINALYSIS DIP (PROADVANTAGE DEVICE)
BILIRUBIN UA: NEGATIVE
GLUCOSE UA: NEGATIVE mg/dL
Ketones, POC UA: NEGATIVE mg/dL
Leukocytes, UA: NEGATIVE
Nitrite, UA: NEGATIVE
PH UA: 6 (ref 5.0–8.0)
Protein Ur, POC: NEGATIVE mg/dL
RBC UA: NEGATIVE
SPECIFIC GRAVITY, URINE: 1.03
Urobilinogen, Ur: NEGATIVE

## 2018-12-22 NOTE — Progress Notes (Signed)
Subjective:   HPI  David Shepherd is a 52 y.o. male who presents for Chief Complaint  Patient presents with  . NP CPE    NP CPE non fasting   Here with wife a patient of mine and here with Eliseo SquiresYkeo Eban, interpreter with [facility]  Medical care team includes: Dentist Eye doctor Establishing care today  Concerns: He notes some dyspnea, worse at night, not so much in the day.  No wheezing.  He is a long time smoker.    No chest pain.  He has chronic issues with ringing in ears, runny nose, sometimes vertigo  Reviewed their medical, surgical, family, social, medication, and allergy history and updated chart as appropriate.  Past Medical History:  Diagnosis Date  . Hypertension     Past Surgical History:  Procedure Laterality Date  . NO PAST SURGERIES  11/2018    Social History   Socioeconomic History  . Marital status: Married    Spouse name: Not on file  . Number of children: Not on file  . Years of education: Not on file  . Highest education level: Not on file  Occupational History  . Not on file  Social Needs  . Financial resource strain: Not on file  . Food insecurity:    Worry: Not on file    Inability: Not on file  . Transportation needs:    Medical: Not on file    Non-medical: Not on file  Tobacco Use  . Smoking status: Current Every Day Smoker    Packs/day: 1.00    Years: 35.00    Pack years: 35.00    Types: Cigarettes  . Smokeless tobacco: Never Used  Substance and Sexual Activity  . Alcohol use: Not Currently    Comment: occasional  . Drug use: Not Currently  . Sexual activity: Not on file  Lifestyle  . Physical activity:    Days per week: Not on file    Minutes per session: Not on file  . Stress: Not on file  Relationships  . Social connections:    Talks on phone: Not on file    Gets together: Not on file    Attends religious service: Not on file    Active member of club or organization: Not on file    Attends meetings of clubs or  organizations: Not on file    Relationship status: Not on file  . Intimate partner violence:    Fear of current or ex partner: Not on file    Emotionally abused: Not on file    Physically abused: Not on file    Forced sexual activity: Not on file  Other Topics Concern  . Not on file  Social History Narrative   Married.  [employer] account executivelift operator.  Exercises some.  11/2018.    Family History  Family history unknown: Yes    No current outpatient medications on file.  No Known Allergies   Review of Systems Constitutional: -fever, -chills, -sweats, -unexpected weight change, -decreased appetite, -fatigue Allergy: -sneezing, -itching, -congestion Dermatology: -changing moles, --rash, -lumps ENT: -runny nose, +ear pain, -sore throat, -hoarseness, -sinus pain, -teeth pain, + ringing in ears, -hearing loss, -nosebleeds Cardiology: -chest pain, -palpitations, -swelling, -difficulty breathing when lying flat, -waking up short of breath Respiratory: -cough, -shortness of breath, -difficulty breathing with exercise or exertion, -wheezing, -coughing up blood Gastroenterology: -abdominal pain, -nausea, -vomiting, -diarrhea, +constipation, -blood in stool, -changes in bowel movement, -difficulty swallowing or eating Hematology: -bleeding, -bruising  Musculoskeletal: -joint aches, -  muscle aches, -joint swelling, -back pain, -neck pain, -cramping, -changes in gait Ophthalmology: denies vision changes, eye redness, itching, discharge Urology: -burning with urination, -difficulty urinating, -blood in urine, -urinary frequency, -urgency, -incontinence Neurology: -headache, -weakness, -tingling, -numbness, -memory loss, -falls, -dizziness Psychology: -depressed mood, -agitation, -sleep problems Breast/gyn: -breast tenderness, -discharge, -lumps, -vaginal discharge,- irregular periods, -heavy periods     Objective:  BP 128/80   Pulse 73   Temp (!) 97.5 F (36.4 C) (Oral)   Resp 16   Ht 5' (1.524 m)    Wt 128 lb 6.4 oz (58.2 kg)   SpO2 98%   BMI 25.08 kg/m   General appearance: alert, no distress, WD/WN, [place] male Skin: unremarkable HEENT: normocephalic, conjunctiva/corneas normal, sclerae anicteric, PERRLA, EOMi, nares patent, no discharge or erythema, pharynx normal Oral cavity: MMM, tongue normal, teeth with no obvious abnormality Neck: supple, no lymphadenopathy, no thyromegaly, no masses, normal ROM, no bruits Chest: non tender, normal shape and expansion Heart: RRR, normal S1, S2, no murmurs Lungs: CTA bilaterally, no wheezes, rhonchi, or rales Abdomen: +bs, soft, non tender, non distended, no masses, no hepatomegaly, no splenomegaly, no bruits Back: non tender, normal ROM, no scoliosis Musculoskeletal: upper extremities non tender, no obvious deformity, normal ROM throughout, lower extremities non tender, no obvious deformity, normal ROM throughout Extremities: no edema, no cyanosis, no clubbing Pulses: 2+ symmetric, upper and lower extremities, normal cap refill Neurological: alert, oriented x 3, CN2-12 intact, strength normal upper extremities and lower extremities, sensation normal throughout, DTRs 2+ throughout, no cerebellar signs, gait normal GU: Normal male, no nodule no lymphadenopathy no hernia. Rectal declined. Psychiatric: normal affect, behavior normal, pleasant     Assessment and Plan :   Encounter Diagnoses  Name Primary?  . Routine general medical examination at a health care facility Yes  . Tobacco abuse   . Screen for colon cancer   . Blurred vision   . Screening for prostate cancer   . Dyspnea, unspecified type   . Constipation, unspecified constipation type   . Vertigo   . Runny nose   . Decreased hearing of both ears   . Tinnitus of both ears     Physical exam - discussed and counseled on healthy lifestyle, diet, exercise, preventative care, vaccinations, sick and well care, proper use of emergency dept and after hours care, and addressed  their concerns.    Health screening: Advised they see their eye doctor yearly for routine vision care. Advised they see their dentist yearly for routine dental care including hygiene visits twice yearly.  Cancer screening Referred for screening colonoscopy  Vaccinations: Advised yearly influenza vaccine   Separate significant chronic issues discussed: Dyspnea, tobacco use-pending labs likely referral for chest x-ray  Blurred vision-labs today, advised to see an eye doctor yearly including soon  Tinnitus, ear anomaly, vertigo, hearing decreased on exam today-referral to ENT  Advised smoking cessation  Constipation - discussed diet, needs to increase water intake, referral to GI for colonoscopy and constipation  Keyandre was seen today for np cpe.  Diagnoses and all orders for this visit:  Routine general medical examination at a health care facility -     POCT Urinalysis DIP (Proadvantage Device) -     PSA -     Comprehensive metabolic panel -     CBC with Differential/Platelet -     Ambulatory referral to Gastroenterology -     Spirometry with graph  Tobacco abuse  Screen for colon cancer -  Ambulatory referral to Gastroenterology  Blurred vision  Screening for prostate cancer -     PSA  Dyspnea, unspecified type  Constipation, unspecified constipation type  Vertigo  Runny nose  Decreased hearing of both ears -     Ambulatory referral to ENT  Tinnitus of both ears -     Ambulatory referral to ENT   Follow-up pending labs, yearly for physical

## 2018-12-23 ENCOUNTER — Other Ambulatory Visit: Payer: Self-pay | Admitting: Medical

## 2018-12-23 ENCOUNTER — Encounter: Payer: Self-pay | Admitting: Medical

## 2018-12-23 LAB — COMPREHENSIVE METABOLIC PANEL
ALBUMIN: 4.7 g/dL (ref 3.5–5.5)
ALK PHOS: 60 IU/L (ref 39–117)
ALT: 18 IU/L (ref 0–44)
AST: 25 IU/L (ref 0–40)
Albumin/Globulin Ratio: 1.5 (ref 1.2–2.2)
BILIRUBIN TOTAL: 0.6 mg/dL (ref 0.0–1.2)
BUN/Creatinine Ratio: 11 (ref 9–20)
BUN: 11 mg/dL (ref 6–24)
CHLORIDE: 102 mmol/L (ref 96–106)
CO2: 23 mmol/L (ref 20–29)
Calcium: 9.5 mg/dL (ref 8.7–10.2)
Creatinine, Ser: 0.96 mg/dL (ref 0.76–1.27)
GFR calc Af Amer: 105 mL/min/{1.73_m2} (ref >59)
GFR calc non Af Amer: 91 mL/min/{1.73_m2} (ref >59)
Globulin, Total: 3.2 g/dL (ref 1.5–4.5)
Glucose: 69 mg/dL (ref 65–99)
Potassium: 4.1 mmol/L (ref 3.5–5.2)
Sodium: 141 mmol/L (ref 134–144)
TOTAL PROTEIN: 7.9 g/dL (ref 6.0–8.5)

## 2018-12-23 LAB — CBC WITH DIFFERENTIAL/PLATELET
Basophils Absolute: 0.1 10*3/uL (ref 0.0–0.2)
Basos: 1 %
EOS (ABSOLUTE): 0.3 10*3/uL (ref 0.0–0.4)
Eos: 3 %
Hematocrit: 51.6 % — ABNORMAL HIGH (ref 37.5–51.0)
Hemoglobin: 15.6 g/dL (ref 13.0–17.7)
Immature Grans (Abs): 0 10*3/uL (ref 0.0–0.1)
Immature Granulocytes: 0 %
Lymphocytes Absolute: 3.3 10*3/uL — ABNORMAL HIGH (ref 0.7–3.1)
Lymphs: 36 %
MCH: 22.3 pg — ABNORMAL LOW (ref 26.6–33.0)
MCHC: 30.2 g/dL — ABNORMAL LOW (ref 31.5–35.7)
MCV: 74 fL — ABNORMAL LOW (ref 79–97)
Monocytes Absolute: 0.9 10*3/uL (ref 0.1–0.9)
Monocytes: 10 %
Neutrophils Absolute: 4.6 10*3/uL (ref 1.4–7.0)
Neutrophils: 50 %
Platelets: 254 10*3/uL (ref 150–450)
RBC: 6.98 x10E6/uL — ABNORMAL HIGH (ref 4.14–5.80)
RDW: 16.3 % — ABNORMAL HIGH (ref 12.3–15.4)
WBC: 9.2 10*3/uL (ref 3.4–10.8)

## 2018-12-23 LAB — PSA: Prostate Specific Ag, Serum: 2.3 ng/mL (ref 0.0–4.0)

## 2018-12-23 MED ORDER — NICOTINE 21 MG/24HR TD PT24: 21.0000 mg | MEDICATED_PATCH | Freq: Every day | TRANSDERMAL | 0 refills | Status: DC

## 2018-12-23 MED ORDER — POLYETHYLENE GLYCOL 3350 17 G PO PACK: 17.0000 g | PACK | Freq: Every day | ORAL | 2 refills | Status: DC

## 2019-01-10 DIAGNOSIS — H900 Conductive hearing loss, bilateral: Secondary | ICD-10-CM | POA: Insufficient documentation

## 2019-01-10 DIAGNOSIS — H9113 Presbycusis, bilateral: Secondary | ICD-10-CM | POA: Insufficient documentation

## 2019-01-16 ENCOUNTER — Encounter: Payer: Self-pay | Admitting: Gastroenterology

## 2019-02-19 ENCOUNTER — Encounter: Payer: Self-pay | Admitting: Gastroenterology

## 2019-03-07 ENCOUNTER — Other Ambulatory Visit: Payer: Self-pay

## 2019-03-07 ENCOUNTER — Ambulatory Visit (INDEPENDENT_AMBULATORY_CARE_PROVIDER_SITE_OTHER): Payer: Self-pay | Admitting: Primary Care

## 2019-03-07 ENCOUNTER — Encounter (INDEPENDENT_AMBULATORY_CARE_PROVIDER_SITE_OTHER): Payer: Self-pay | Admitting: Primary Care

## 2019-03-07 VITALS — BP 149/102 | HR 87 | Temp 97.5°F | Ht 60.0 in | Wt 136.0 lb

## 2019-03-07 DIAGNOSIS — Z23 Encounter for immunization: Secondary | ICD-10-CM

## 2019-03-07 DIAGNOSIS — Z7689 Persons encountering health services in other specified circumstances: Secondary | ICD-10-CM

## 2019-03-07 DIAGNOSIS — Z720 Tobacco use: Secondary | ICD-10-CM

## 2019-03-07 DIAGNOSIS — R030 Elevated blood-pressure reading, without diagnosis of hypertension: Secondary | ICD-10-CM

## 2019-03-07 MED ORDER — IBUPROFEN 600 MG PO TABS: 600.0000 mg | ORAL_TABLET | Freq: Three times a day (TID) | ORAL | 0 refills | Status: AC

## 2019-03-07 MED FILL — IBUPROFEN 600 MG TABLET: 600 | 30 days supply | Qty: 90 | Fill #0

## 2019-03-07 NOTE — Progress Notes (Signed)
New Patient Office Visit  Subjective:  Patient ID: David Shepherd, male    DOB: [birth year]  Age: 53 y.o. MRN: [id number]  CC:  Chief Complaint  Patient presents with  . New Patient (Initial Visit)    ringing in ears/ hearing loss/ headaches   Past Medical History:  Diagnosis Date  . Hypertension     Past Surgical History:  Procedure Laterality Date  . NO PAST SURGERIES  11/2018    Family History  Family history unknown: Yes    Social History   Socioeconomic History  . Marital status: Married    Spouse name: Not on file  . Number of children: Not on file  . Years of education: Not on file  . Highest education level: Not on file  Occupational History  . Not on file  Social Needs  . Financial resource strain: Not on file  . Food insecurity:    Worry: Not on file    Inability: Not on file  . Transportation needs:    Medical: Not on file    Non-medical: Not on file  Tobacco Use  . Smoking status: Current Every Day Smoker    Packs/day: 1.00    Years: 35.00    Pack years: 35.00    Types: Cigarettes  . Smokeless tobacco: Never Used  Substance and Sexual Activity  . Alcohol use: Not Currently    Comment: occasional  . Drug use: Not Currently  . Sexual activity: Not on file  Lifestyle  . Physical activity:    Days per week: Not on file    Minutes per session: Not on file  . Stress: Not on file  Relationships  . Social connections:    Talks on phone: Not on file    Gets together: Not on file    Attends religious service: Not on file    Active member of club or organization: Not on file    Attends meetings of clubs or organizations: Not on file    Relationship status: Not on file  . Intimate partner violence:    Fear of current or ex partner: Not on file    Emotionally abused: Not on file    Physically abused: Not on file    Forced sexual activity: Not on file  Other Topics Concern  . 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   Exercises some.  11/2018.    ROS Review of Systems  Constitutional: Positive for fatigue.  HENT: Positive for ear pain.   Eyes: Positive for photophobia and visual disturbance.  Respiratory: Negative.   Cardiovascular: Negative.   Gastrointestinal: Negative.  Negative for abdominal distention.  Endocrine: Negative.   Genitourinary: Positive for urgency.  Musculoskeletal: Positive for neck pain.       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     Objective:   Today's Vitals: BP (!) 149/102 (BP Location: Right Arm, Patient Position: Sitting, Cuff Size: Normal)   Pulse 87   Temp (!) 97.5 F (36.4 C) (Oral)   Ht 5' (1.524 m)   Wt 136 lb (61.7 kg)   SpO2 96%   BMI 26.56 kg/m   Physical Exam HENT:     Head: Normocephalic.     Right Ear: Tympanic membrane, ear canal and external ear normal.     Left Ear: Tympanic membrane, ear canal and external ear  normal.     Nose: Nose normal.  Neck:     Musculoskeletal: Normal range of motion.  Cardiovascular:     Rate and Rhythm: Normal rate and regular rhythm.  Pulmonary:     Effort: Pulmonary effort is normal.     Breath sounds: Normal breath sounds.  Musculoskeletal:        General: Signs of injury present.     Comments: Decrease ROM left arm   Neurological:     Mental Status: He is alert.     Assessment & Plan:     1. Encounter to establish care with new doctor  - CBC with Differential - Comprehensive metabolic panel - Lipid Panel  2. Tobacco abuse Each visit discuss cessation   3. Elevated blood pressure reading in office without diagnosis of hypertension Will f/u on next visit - Comprehensive metabolic panel  Follow-up: Return in about 4 weeks (around 04/04/2019) for ck Bp .   Grayce Sessions, NP

## 2019-03-08 ENCOUNTER — Encounter (INDEPENDENT_AMBULATORY_CARE_PROVIDER_SITE_OTHER): Payer: Self-pay | Admitting: Primary Care

## 2019-03-08 LAB — LIPID PANEL
CHOLESTEROL TOTAL: 187 mg/dL (ref 100–199)
Chol/HDL Ratio: 5.7 ratio — ABNORMAL HIGH (ref 0.0–5.0)
HDL: 33 mg/dL — ABNORMAL LOW (ref >39)
LDL CALC: 114 mg/dL — AB (ref 0–99)
TRIGLYCERIDES: 200 mg/dL — AB (ref 0–149)
VLDL CHOLESTEROL CAL: 40 mg/dL (ref 5–40)

## 2019-03-08 LAB — COMPREHENSIVE METABOLIC PANEL
ALT: 19 IU/L (ref 0–44)
AST: 27 IU/L (ref 0–40)
Albumin/Globulin Ratio: 1.5 (ref 1.2–2.2)
Albumin: 4.6 g/dL (ref 3.8–4.9)
Alkaline Phosphatase: 48 IU/L (ref 39–117)
BUN/Creatinine Ratio: 15 (ref 9–20)
BUN: 15 mg/dL (ref 6–24)
Bilirubin Total: 0.5 mg/dL (ref 0.0–1.2)
CALCIUM: 9.5 mg/dL (ref 8.7–10.2)
CO2: 22 mmol/L (ref 20–29)
CREATININE: 1.03 mg/dL (ref 0.76–1.27)
Chloride: 101 mmol/L (ref 96–106)
GFR calc Af Amer: 96 mL/min/{1.73_m2} (ref >59)
GFR calc non Af Amer: 83 mL/min/{1.73_m2} (ref >59)
GLOBULIN, TOTAL: 3 g/dL (ref 1.5–4.5)
Glucose: 82 mg/dL (ref 65–99)
Potassium: 4.4 mmol/L (ref 3.5–5.2)
SODIUM: 138 mmol/L (ref 134–144)
Total Protein: 7.6 g/dL (ref 6.0–8.5)

## 2019-03-08 LAB — CBC WITH DIFFERENTIAL/PLATELET
BASOS: 1 %
Basophils Absolute: 0.1 10*3/uL (ref 0.0–0.2)
EOS (ABSOLUTE): 0.3 10*3/uL (ref 0.0–0.4)
EOS: 3 %
HEMATOCRIT: 49.7 % (ref 37.5–51.0)
Hemoglobin: 15.4 g/dL (ref 13.0–17.7)
Immature Grans (Abs): 0.1 10*3/uL (ref 0.0–0.1)
Immature Granulocytes: 1 %
Lymphocytes Absolute: 2.6 10*3/uL (ref 0.7–3.1)
Lymphs: 27 %
MCH: 22.5 pg — ABNORMAL LOW (ref 26.6–33.0)
MCHC: 31 g/dL — ABNORMAL LOW (ref 31.5–35.7)
MCV: 73 fL — ABNORMAL LOW (ref 79–97)
MONOS ABS: 1.1 10*3/uL — AB (ref 0.1–0.9)
Monocytes: 11 %
Neutrophils Absolute: 5.6 10*3/uL (ref 1.4–7.0)
Neutrophils: 57 %
Platelets: 234 10*3/uL (ref 150–450)
RBC: 6.85 x10E6/uL — ABNORMAL HIGH (ref 4.14–5.80)
RDW: 15.8 % — AB (ref 11.6–15.4)
WBC: 9.7 10*3/uL (ref 3.4–10.8)

## 2019-03-08 MED ORDER — ATORVASTATIN CALCIUM 20 MG PO TABS: 20.0000 mg | ORAL_TABLET | Freq: Every day | ORAL | 3 refills | Status: DC

## 2019-03-09 MED FILL — ATORVASTATIN 20 MG TABLET: 20 | 90 days supply | Qty: 90 | Fill #0

## 2019-03-14 ENCOUNTER — Ambulatory Visit: Payer: Self-pay | Attending: Family Medicine

## 2019-03-14 ENCOUNTER — Other Ambulatory Visit: Payer: Self-pay

## 2019-04-04 ENCOUNTER — Ambulatory Visit (INDEPENDENT_AMBULATORY_CARE_PROVIDER_SITE_OTHER): Payer: Self-pay

## 2019-04-04 ENCOUNTER — Ambulatory Visit: Payer: Self-pay | Attending: Primary Care

## 2019-04-04 ENCOUNTER — Other Ambulatory Visit: Payer: Self-pay

## 2019-04-04 ENCOUNTER — Ambulatory Visit: Payer: Self-pay | Admitting: Primary Care

## 2019-04-04 VITALS — BP 153/99

## 2019-04-04 DIAGNOSIS — Z0130 Encounter for examination of blood pressure without abnormal findings: Secondary | ICD-10-CM

## 2019-04-04 MED ORDER — AMLODIPINE BESYLATE 5 MG PO TABS: 5.0000 mg | ORAL_TABLET | Freq: Every day | ORAL | 0 refills | Status: DC

## 2019-04-04 MED ORDER — HYDROCHLOROTHIAZIDE 25 MG PO TABS: 25.0000 mg | ORAL_TABLET | Freq: Every day | ORAL | 0 refills | Status: DC

## 2019-04-04 NOTE — Patient Instructions (Signed)
Please take Blood Pressure Medication once a day   Please check blood pressure twice a day and bring readings in when you see David Shepherd

## 2019-04-04 NOTE — Progress Notes (Signed)
Pt came in today for a bp check only  Per Marcelino Duster she will like pt to start HCTZ 25 mg once a day and Norvasc 5 mg once a day   Per Marcelino Duster she wants pt to schedule a 2 week bp check with Franky Macho

## 2019-04-18 ENCOUNTER — Other Ambulatory Visit: Payer: Self-pay

## 2019-04-18 ENCOUNTER — Ambulatory Visit: Payer: Self-pay | Attending: Primary Care | Admitting: Pharmacist

## 2019-04-18 VITALS — BP 153/88 | HR 87

## 2019-04-18 DIAGNOSIS — I10 Essential (primary) hypertension: Secondary | ICD-10-CM

## 2019-04-18 MED ORDER — HYDROCHLOROTHIAZIDE 25 MG PO TABS: 25.0000 mg | ORAL_TABLET | Freq: Every day | ORAL | 0 refills | Status: DC

## 2019-04-18 MED FILL — HYDROCHLOROTHIAZIDE 25 MG T: 25 | 30 days supply | Qty: 30 | Fill #0

## 2019-04-18 NOTE — Progress Notes (Signed)
   S:    PCP: Gwinda Passe  Patient arrives in good spirits. Presents to the clinic for hypertension evaluation, counseling, and management. Patient was referred by Marcelino Duster on 04/04/19. Pt had a BP check that day - 153/99. Marcelino Duster had patient start HCTZ 25 mg daily with amlodipine 5 mg daily.   Patient denies adherence with medications. Stopped 1 week after starting d/t palpitations.   Current BP Medications include:  HCTZ 25 mg daily, amlodipine 5 mg daily  Antihypertensives tried in the past include: lisinopril 10 mg  Dietary habits include:  - Not limiting salt - Admits to drinking coffee throughout the morning Exercise habits include: - denies  Family / Social history:  -FHx: no pertinent positives -Tobacco: 1 PPD smoker (35 yr) -Alcohol: occasionally   Home BP readings: reports SBPs 140s-150s  O:  L arm after 5 minutes rest: 153/88, HR 87  Last 3 Office BP readings: BP Readings from Last 3 Encounters:  04/18/19 (!) 153/88  04/04/19 (!) 153/99  03/07/19 (!) 149/102   BMET    Component Value Date/Time   NA 138 03/07/2019 1010   K 4.4 03/07/2019 1010   CL 101 03/07/2019 1010   CO2 22 03/07/2019 1010   GLUCOSE 82 03/07/2019 1010   GLUCOSE 129 (H) 04/04/2015 1002   BUN 15 03/07/2019 1010   CREATININE 1.03 03/07/2019 1010   CALCIUM 9.5 03/07/2019 1010   GFRNONAA 83 03/07/2019 1010   GFRAA 96 03/07/2019 1010   Renal function: CrCl cannot be calculated (Patient's most recent lab result is older than the maximum 21 days allowed.).  Clinical ASCVD: No  The 10-year ASCVD risk score Denman George DC Jr., et al., 2013) is: 19.3%   Values used to calculate the score:     Age: 53 years     Sex: Male     Is Non-Hispanic African American: No     Diabetic: No     Tobacco smoker: Yes     Systolic Blood Pressure: 153 mmHg     Is BP treated: Yes     HDL Cholesterol: 33 mg/dL     Total Cholesterol: 187 mg/dL  A/P: Hypertension longstanding currently uncontrolled on current  medications. BP Goal <130/80 mmHg. Patient is not adherent with current medications. Will hold amlodipine for now; with both medications started at the same time, it is hard to know which agent may be contributing to palpitations. Amlodipine can cause these. Will tx with HCTZ only to see if the patient can tolerate.    -Restarted HCTZ 25 mg daily -Hold amlodipine for now.   -Counseled on lifestyle modifications for blood pressure control including reduced dietary sodium, increased exercise, adequate sleep -ASCVD: primary prevention in patient with risk score of 19.3%. Currently on moderate intensity statin therapy. May benefit from intensification as ASCVD risk score is close to >20%. HM:  Pneumovax indicated w/ smoking hx - will address at f/u appt.   Results reviewed and written information provided. Total time in face-to-face counseling 10 minutes.   F/U Clinic Visit in 2 weeks.   Butch Penny, PharmD, CPP Clinical Pharmacist [facility] [phone]

## 2019-04-19 ENCOUNTER — Encounter: Payer: Self-pay | Admitting: Pharmacist

## 2019-04-19 NOTE — Patient Instructions (Signed)
Thank you for coming to see [place] today.   Blood pressure today is elevated.  Restart HCTZ, stop amlodipine. Take the HCTZ every day in the morning (1 tablet).  Limiting salt and caffeine, as well as exercising as able for at least 30 minutes for 5 days out of the week, can also help you lower your blood pressure.  Take your blood pressure at home if you are able. Please write down these numbers and bring them to your visits.  If you have any questions about medications, please call me [phone].  Franky Macho

## 2019-05-02 ENCOUNTER — Other Ambulatory Visit: Payer: Self-pay

## 2019-05-02 ENCOUNTER — Encounter: Payer: Self-pay | Admitting: Pharmacist

## 2019-05-02 ENCOUNTER — Ambulatory Visit: Payer: Self-pay | Attending: Family Medicine | Admitting: Pharmacist

## 2019-05-02 VITALS — BP 119/80 | HR 80

## 2019-05-02 DIAGNOSIS — I10 Essential (primary) hypertension: Secondary | ICD-10-CM

## 2019-05-02 MED ORDER — LISINOPRIL 10 MG PO TABS: 10.0000 mg | ORAL_TABLET | Freq: Every day | ORAL | 2 refills | Status: DC

## 2019-05-02 MED FILL — LISINOPRIL 10 MG TABS: 10 | 30 days supply | Qty: 30 | Fill #0

## 2019-05-02 NOTE — Progress Notes (Signed)
   S:    PCP: Gwinda Passe  Patient arrives in good spirits. Presents to the clinic for hypertension evaluation, counseling, and management. Patient was referred by Marcelino Duster on 04/04/19.   Patient denies adherence with medications. Stopped HCTZ after ~3 days d/t HA.   Current BP Medications include:  HCTZ 25 mg daily  Antihypertensives tried in the past include: lisinopril 10 mg  Dietary habits include:  - Not limiting salt - Admits to drinking coffee throughout the morning Exercise habits include: - denies  Family / Social history:  -FHx: no pertinent positives -Tobacco: 1 PPD smoker (35 yr) -Alcohol: occasionally   Home BP readings: reports SBPs 140s-150s/100s  O:  L arm after 5 minutes rest: 119/80, HR 80  Last 3 Office BP readings: BP Readings from Last 3 Encounters:  04/18/19 (!) 153/88  04/04/19 (!) 153/99  03/07/19 (!) 149/102   BMET    Component Value Date/Time   NA 138 03/07/2019 1010   K 4.4 03/07/2019 1010   CL 101 03/07/2019 1010   CO2 22 03/07/2019 1010   GLUCOSE 82 03/07/2019 1010   GLUCOSE 129 (H) 04/04/2015 1002   BUN 15 03/07/2019 1010   CREATININE 1.03 03/07/2019 1010   CALCIUM 9.5 03/07/2019 1010   GFRNONAA 83 03/07/2019 1010   GFRAA 96 03/07/2019 1010   Renal function: CrCl cannot be calculated (Patient's most recent lab result is older than the maximum 21 days allowed.).  Clinical ASCVD: No  The 10-year ASCVD risk score Denman George DC Jr., et al., 2013) is: 19.3%   Values used to calculate the score:     Age: 53 years     Sex: Male     Is Non-Hispanic African American: No     Diabetic: No     Tobacco smoker: Yes     Systolic Blood Pressure: 153 mmHg     Is BP treated: Yes     HDL Cholesterol: 33 mg/dL     Total Cholesterol: 187 mg/dL  A/P: Hypertension longstanding currently uncontrolled on current medications. BP Goal <130/80 mmHg. Patient is not adherent to medications. He cannot tolerate amlodipine or HCTZ. His pressure today is  good but he's concerned over continued high pressures at home. Will try lisinopril 10 mg daily.   -Stop HCTZ -Try lisinopril 10 mg daily.  -Counseled on lifestyle modifications for blood pressure control including reduced dietary sodium, increased exercise, adequate sleep -ASCVD: primary prevention in patient with risk score of 19.3%. Currently on moderate intensity statin therapy. May benefit from intensification as ASCVD risk score is close to >20%. HM:  Pneumovax indicated w/ smoking hx - pt declined   Results reviewed and written information provided. Total time in face-to-face counseling 10 minutes.   F/U Clinic Visit in 2 weeks.  Butch Penny, PharmD, CPP Clinical Pharmacist [facility] [phone]

## 2019-05-02 NOTE — Patient Instructions (Addendum)
Thank you for coming to see [place] today.   Blood pressure today is okay.   Start taking lisinopril 10 mg daily.   Limiting salt and caffeine, as well as exercising as able for at least 30 minutes for 5 days out of the week, can also help you lower your blood pressure.  Take your blood pressure at home if you are able. Please write down these numbers and bring them to your visits.  If you have any questions about medications, please call me [phone].  Franky Macho

## 2019-06-05 ENCOUNTER — Other Ambulatory Visit: Payer: Self-pay

## 2019-06-05 ENCOUNTER — Ambulatory Visit: Payer: Self-pay | Attending: Family Medicine | Admitting: Pharmacist

## 2019-06-05 ENCOUNTER — Encounter (INDEPENDENT_AMBULATORY_CARE_PROVIDER_SITE_OTHER): Payer: Self-pay

## 2019-06-05 VITALS — BP 121/79 | HR 93

## 2019-06-05 DIAGNOSIS — I10 Essential (primary) hypertension: Secondary | ICD-10-CM

## 2019-06-05 NOTE — Patient Instructions (Signed)

## 2019-06-05 NOTE — Progress Notes (Signed)
   S:    PCP: Juluis Mire  Patient arrives in good spirits. Presents to the clinic for hypertension evaluation, counseling, and management. Patient was referred by Sharyn Lull on 04/04/19.   Patient reports adherence with medications.   Current BP Medications include:  Lisinopril 10 mg daily  Dietary habits include:  - Limits salt - Admits to drinking coffee throughout the morning Exercise habits include: - denies  Family / Social history:  -FHx: no pertinent positives -Tobacco: 1 PPD smoker (35 yr) -Alcohol: occasionally   Home BP readings: reports goals Bps at home  O:  L arm after 5 minutes rest: 121/79, HR 93  Last 3 Office BP readings: BP Readings from Last 3 Encounters:  06/05/19 121/79  05/02/19 119/80  04/18/19 (!) 153/88   BMET    Component Value Date/Time   NA 138 03/07/2019 1010   K 4.4 03/07/2019 1010   CL 101 03/07/2019 1010   CO2 22 03/07/2019 1010   GLUCOSE 82 03/07/2019 1010   GLUCOSE 129 (H) 04/04/2015 1002   BUN 15 03/07/2019 1010   CREATININE 1.03 03/07/2019 1010   CALCIUM 9.5 03/07/2019 1010   GFRNONAA 83 03/07/2019 1010   GFRAA 96 03/07/2019 1010   Renal function: CrCl cannot be calculated (Patient's most recent lab result is older than the maximum 21 days allowed.).  Clinical ASCVD: No  The 10-year ASCVD risk score Mikey Bussing DC Jr., et al., 2013) is: 13.1%   Values used to calculate the score:     Age: 53 years     Sex: Male     Is Non-Hispanic African American: No     Diabetic: No     Tobacco smoker: Yes     Systolic Blood Pressure: 778 mmHg     Is BP treated: Yes     HDL Cholesterol: 33 mg/dL     Total Cholesterol: 187 mg/dL  A/P: Hypertension longstanding currently controlledon current medications. BP Goal <130/80 mmHg. Patient is adherent to medications.   -Continue lisinopril 10 mg daily.  -Counseled on lifestyle modifications for blood pressure control including reduced dietary sodium, increased exercise, adequate sleep  -ASCVD: primary prevention in patient with risk score of 13.1%. Recommend to continue atorvastatin 20 mg. HM:  Pneumovax indicated w/ smoking hx - pt declined   Results reviewed and written information provided. Total time in face-to-face counseling 10 minutes.   F/U with PCP.   Benard Halsted, PharmD, [place] [phone]

## 2019-06-06 ENCOUNTER — Encounter: Payer: Self-pay | Admitting: Pharmacist

## 2020-05-15 ENCOUNTER — Encounter: Payer: Self-pay | Admitting: Medical

## 2020-05-15 ENCOUNTER — Other Ambulatory Visit: Payer: Self-pay

## 2020-05-15 ENCOUNTER — Ambulatory Visit (INDEPENDENT_AMBULATORY_CARE_PROVIDER_SITE_OTHER): Payer: 59 | Admitting: Medical

## 2020-05-15 VITALS — BP 128/92 | HR 86 | Ht 60.0 in | Wt 129.8 lb

## 2020-05-15 DIAGNOSIS — Z720 Tobacco use: Secondary | ICD-10-CM

## 2020-05-15 DIAGNOSIS — R200 Anesthesia of skin: Secondary | ICD-10-CM | POA: Insufficient documentation

## 2020-05-15 DIAGNOSIS — I10 Essential (primary) hypertension: Secondary | ICD-10-CM | POA: Insufficient documentation

## 2020-05-15 DIAGNOSIS — M5412 Radiculopathy, cervical region: Secondary | ICD-10-CM

## 2020-05-15 DIAGNOSIS — M79602 Pain in left arm: Secondary | ICD-10-CM | POA: Insufficient documentation

## 2020-05-15 DIAGNOSIS — R202 Paresthesia of skin: Secondary | ICD-10-CM

## 2020-05-15 DIAGNOSIS — R29898 Other symptoms and signs involving the musculoskeletal system: Secondary | ICD-10-CM | POA: Insufficient documentation

## 2020-05-15 DIAGNOSIS — M549 Dorsalgia, unspecified: Secondary | ICD-10-CM | POA: Diagnosis not present

## 2020-05-15 MED ORDER — AMLODIPINE BESYLATE 5 MG PO TABS: 5.0000 mg | ORAL_TABLET | Freq: Every day | ORAL | 3 refills | Status: DC

## 2020-05-15 MED ORDER — GABAPENTIN 100 MG PO CAPS: 200.0000 mg | ORAL_CAPSULE | Freq: Every day | ORAL | 1 refills | Status: DC

## 2020-05-15 NOTE — Patient Instructions (Signed)
High blood pressure  Since you didn't tolerate Lisinopril, lets try Amlodipine 5mg , 1 tablet daily in the morning   Neck pain, radiculopathy, tingling  We will request record from [place] Gabapentin 100mg , 2 capsules daily at bedtime  Stretch daily  We will see you back in 3-4 weeks  You may need to see therapy or orthopedics   Huy?t p cao V b?n khng dung n?p Lisinopril, hy th? dng Amlodipine 5mg , 1 vin m?i ngy vo bu?i sng   ?au c?, b?nh c?, ng?a ran Chng ti s? yu c?u h? s? t? [facility] B?t ??u dng Gabapentin 100mg , 2 vin m?i ngy tr??c khi ?i ng? Ko di hng [employer] ti s? g?p l?i b?n sau 3-4 tu?n n?a B?n c th? c?n ?i khm tr? li?u ho?c ch?nh hnh     Cervical Radiculopathy  Cervical radiculopathy means that a nerve in the neck (a cervical nerve) is pinched or bruised. This can happen because of an injury to the cervical spine (vertebrae) in the neck, or as a normal part of getting older. This can cause pain or loss of feeling (numbness) that runs from your neck all the way down to your arm and fingers. Often, this condition gets better with rest. Treatment may be needed if the condition does not get better. What are the causes?  A neck injury.  A bulging disk in your spine.  Muscle movements that you cannot control (muscle spasms).  Tight muscles in your neck due to overuse.  Arthritis.  Breakdown in the bones and joints of the spine (spondylosis) due to getting older.  Bone spurs that form near the nerves in the neck. What are the signs or symptoms?  Pain. The pain may: ? Run from the neck to the arm and hand. ? Be very bad or irritating. ? Be worse when you move your neck.  Loss of feeling or tingling in your arm or hand.  Weakness in your arm or hand, in very bad cases. How is this treated? In many cases, treatment is not needed for this condition. With rest, the condition often gets better over time.  If treatment is needed, options may include:  Wearing a soft neck collar (cervical collar) for short periods of time, as told by your doctor.  Doing exercises (physical therapy) to strengthen your neck muscles.  Taking medicines.  Having shots (injections) in your spine, in very bad cases.  Having surgery. This may be needed if other treatments do not help. The type of surgery that is used depends on the cause of your condition. Follow these instructions at home: If you have a soft neck collar:  Wear it as told by your doctor. Remove it only as told by your doctor.  Ask your doctor if you can remove the collar for cleaning and bathing. If you are allowed to remove the collar for cleaning or bathing: ? Follow instructions from your doctor about how to remove the collar safely. ? Clean the collar by wiping it with mild soap and water and drying it completely. ? Take out any removable pads in the collar every 1-2 days. Wash them by hand with soap and water. Let them air-dry completely before you put them back in the collar. ? Check your skin under the collar for redness or sores. If you see any, tell your doctor. Managing pain      Take over-the-counter and prescription medicines only as told by your doctor.  If  told, put ice on the painful area. ? If you have a soft neck collar, remove it as told by your doctor. ? Put ice in a plastic bag. ? Place a towel between your skin and the bag. ? Leave the ice on for 20 minutes, 2-3 times a day.  If using ice does not help, you can try using heat. Use the heat source that your doctor recommends, such as a moist heat pack or a heating pad. ? Place a towel between your skin and the heat source. ? Leave the heat on for 20-30 minutes. ? Remove the heat if your skin turns bright red. This is very important if you are unable to feel pain, heat, or cold. You may have a greater risk of getting burned.  You may try a gentle neck and shoulder rub  (massage). Activity  Rest as needed.  Return to your normal activities as told by your doctor. Ask your doctor what activities are safe for you.  Do exercises as told by your doctor or physical therapist.  Do not lift anything that is heavier than 10 lb (4.5 kg) until your doctor tells you that it is safe. General instructions  Use a flat pillow when you sleep.  Do not drive while wearing a soft neck collar. If you do not have a soft neck collar, ask your doctor if it is safe to drive while your neck heals.  Ask your doctor if the medicine prescribed to you requires you to avoid driving or using heavy machinery.  Do not use any products that contain nicotine or tobacco, such as cigarettes, e-cigarettes, and chewing tobacco. These can delay healing. If you need help quitting, ask your doctor.  Keep all follow-up visits as told by your doctor. This is important. Contact a doctor if:  Your condition does not get better with treatment. Get help right away if:  Your pain gets worse and is not helped with medicine.  You lose feeling or feel weak in your hand, arm, face, or leg.  You have a high fever.  You have a stiff neck.  You cannot control when you poop or pee (have incontinence).  You have trouble with walking, balance, or talking. Summary  Cervical radiculopathy means that a nerve in the neck is pinched or bruised.  A nerve can get pinched from a bulging disk, arthritis, an injury to the neck, or other causes.  Symptoms include pain, tingling, or loss of feeling that goes from the neck into the arm or hand.  Weakness in your arm or hand can happen in very bad cases.  Treatment may include resting, wearing a soft neck collar, and doing exercises. You might need to take medicines for pain. In very bad cases, shots or surgery may be needed. This information is not intended to replace advice given to you by your health care provider. Make sure you discuss any questions  you have with your health care provider. Document Revised: 11/03/2018 Document Reviewed: 11/03/2018 Elsevier Patient Education  2020 [employer].

## 2020-05-15 NOTE — Progress Notes (Signed)
Subjective:  David Shepherd is a 54 y.o. male who presents for Chief Complaint  Patient presents with  . Headache    tingling around head and neck and weakness in bend of arm      Here for concern.  Returning patient.  Last visit here 2019.  Due to insurance change, he was seeing another office the past year or more.     Here with interpretor YKeo Eban  Right handed.  Been having weakness in left forearm with tingling up arm and around head.   tingling around left and back of neck ,but not really pain.  This has been going on over a year.    He notes some type of injury at work back in 2019.  He was seeing Worker's Comp. for a while through [employer] orthopedist.  He ended up having 3 steroid shots including some in the neck and some in the shoulder on the left.  He thinks they helped a little.  At some point from what I can gather although the history was difficult, they either released him from [employer]. and he lost his job or there was a disagreement on how to move forward.  This pain, tingling and weakness has continued.    Hypertension-he was on lisinopril.  He quit taking BP medication as he felt weird on the medication.  He felt like it gave him palpitations.  He denies chest pain or dyspnea.  No other aggravating or relieving factors.    No other c/o.  Past Medical History:  Diagnosis Date  . Hypertension    Current Outpatient Medications on File Prior to Visit  Medication Sig Dispense Refill  . atorvastatin (LIPITOR) 20 MG tablet Take 1 tablet (20 mg total) by mouth daily. (Patient not taking: Reported on 05/15/2020) 90 tablet 3   No current facility-administered medications on file prior to visit.    The following portions of the patient's history were reviewed and updated as appropriate: allergies, current medications, past family history, past medical history, past social history, past surgical history and problem list.  ROS Otherwise as in subjective  above    Objective: BP (!) 128/92   Pulse 86   Ht 5' (1.524 m)   Wt 129 lb 12.8 oz (58.9 kg)   SpO2 98%   BMI 25.35 kg/m   Wt Readings from Last 3 Encounters:  05/15/20 129 lb 12.8 oz (58.9 kg)  03/07/19 136 lb (61.7 kg)  12/22/18 128 lb 6.4 oz (58.2 kg)   BP Readings from Last 3 Encounters:  05/15/20 (!) 128/92  06/05/19 121/79  05/02/19 119/80    General appearance: alert, no distress, well developed, well nourished, [place] /[place]namese male Neck: Neck range of motion-extension seem decreased in general and range of motion in general decreased, is about 90% of normal, nontender, otherwise supple, no lymphadenopathy, no thyromegaly, no masses Tender left upper back and supraspinatus, otherwise back nontender No particular tenderness to palpation of left arm, no obvious deformity, range of motion seems normal Heart: RRR, normal S1, S2, no murmurs Lungs: CTA bilaterally, no wheezes, rhonchi, or rales Pulses: 2+ radial pulses, 2+ pedal pulses, normal cap refill Ext: no edema Arms seem neurovascularly intact, negative Phalen and Tinel's    Assessment: Encounter Diagnoses  Name Primary?  . Decreased ROM of neck Yes  . Upper back pain   . Left arm pain   . Cervical radiculopathy   . Numbness and tingling in left arm   . Essential hypertension, benign   .  Tobacco use      Plan: The history was initially difficult to ascertain.  But it sounds like he had a Worker's Comp. related injury back in 2019, saw [facility], had steroid injections and treatment but at some point there was a discontinuation of care.  At this point his symptoms persist daily.  Begin trial gabapentin below.  Discussed risk benefits and proper use of medication.  We will request records from [place].  There is good chance he will need to go back to [place] for further evaluation and treatment.  He possibly has some cervical radiculopathy that could possibly  require surgery or physical therapy  Hypertension-he reports adverse effects of lisinopril.  Begin trial of amlodipine.  Discussed risk benefits and proper use of medicine  I recommend he quit tobacco.  Gave counseling on cessation.  He is not ready to quit  [facility] was seen today for headache.  Diagnoses and all orders for this visit:  Decreased ROM of neck  Upper back pain  Left arm pain  Cervical radiculopathy  Numbness and tingling in left arm  Essential hypertension, benign  Tobacco use  Other orders -     amLODipine (NORVASC) 5 MG tablet; Take 1 tablet (5 mg total) by mouth daily. -     gabapentin (NEURONTIN) 100 MG capsule; Take 2 capsules (200 mg total) by mouth at bedtime.  Spent > 45 minutes face to face with patient in discussion of symptoms, evaluation, plan and recommendations.    Follow up: 3 to 4 weeks

## 2020-05-29 ENCOUNTER — Telehealth: Payer: Self-pay | Admitting: Medical

## 2020-05-30 NOTE — Telephone Encounter (Signed)
Error

## 2020-06-12 ENCOUNTER — Other Ambulatory Visit: Payer: Self-pay

## 2020-06-12 ENCOUNTER — Encounter: Payer: Self-pay | Admitting: Medical

## 2020-06-12 ENCOUNTER — Ambulatory Visit (INDEPENDENT_AMBULATORY_CARE_PROVIDER_SITE_OTHER): Payer: 59 | Admitting: Medical

## 2020-06-12 ENCOUNTER — Other Ambulatory Visit: Payer: Self-pay | Admitting: Medical

## 2020-06-12 VITALS — BP 130/88 | HR 87 | Ht 60.0 in | Wt 130.0 lb

## 2020-06-12 DIAGNOSIS — R202 Paresthesia of skin: Secondary | ICD-10-CM | POA: Diagnosis not present

## 2020-06-12 DIAGNOSIS — R718 Other abnormality of red blood cells: Secondary | ICD-10-CM

## 2020-06-12 DIAGNOSIS — I10 Essential (primary) hypertension: Secondary | ICD-10-CM | POA: Diagnosis not present

## 2020-06-12 DIAGNOSIS — Z720 Tobacco use: Secondary | ICD-10-CM | POA: Diagnosis not present

## 2020-06-12 DIAGNOSIS — Z136 Encounter for screening for cardiovascular disorders: Secondary | ICD-10-CM | POA: Insufficient documentation

## 2020-06-12 DIAGNOSIS — R61 Generalized hyperhidrosis: Secondary | ICD-10-CM | POA: Insufficient documentation

## 2020-06-12 DIAGNOSIS — R200 Anesthesia of skin: Secondary | ICD-10-CM

## 2020-06-12 DIAGNOSIS — E782 Mixed hyperlipidemia: Secondary | ICD-10-CM

## 2020-06-12 MED ORDER — ATORVASTATIN CALCIUM 20 MG PO TABS: 20.0000 mg | ORAL_TABLET | Freq: Every day | ORAL | 3 refills | Status: DC

## 2020-06-12 NOTE — Telephone Encounter (Signed)
Is this okay to refill? 

## 2020-06-12 NOTE — Patient Instructions (Addendum)
High blood pressure  Depending on your lab results I may have you back off to amlodipine 2.5 mg daily or half tablet daily  The goal is around 120/70 blood pressure.  Readings less than 100/60 would be too low   Numbness and tingling  I am checking additional labs today  You have had a slightly abnormal blood count reading in the past.  This could be possibly a cause of the numbness and tingling which could be related to things like iron or B12 deficiency or other blood disorder  Continue gabapentin for now.  I may end up increasing the dose.   High cholesterol  Restart Lipitor atorvastatin daily at bedtime to lower cholesterol and lower risk of heart disease    Tobacco use I strongly recommend you quit tobacco    Huy?t p cao Ty thu?c vo k?t qu? phng th nghi?m c?a b?n, ti c th? cho b?n s? d?ng l?i amlodipine 2,5 mg m?i ngy ho?c n?a vin m?i ngy M?c tiu l huy?t p kho?ng 120/70. S? l?n ??c d??i 100/60 s? qu th?p   T v ng?a ran Ti ?ang ki?m tra cc phng th nghi?m b? sung hm nay B?n ? c k?t qu? ?o cng th?c mu h?i b?t th??ng trong qu kh?. ?i?u ny c th? l m?t nguyn nhn gy ra t v ng?a ran c th? lin quan ??n nh?ng th? nh? thi?u s?t ho?c B12 ho?c r?i lo?n mu khc Ti?p t?c gabapentin by gi?Marland Kitchen Ti c th? s? t?ng li?u.   Cholesterol cao Kh?i ??ng l?i Lipitor atorvastatin hng ngy tr??c khi ?i ng? ?? gi?m cholesterol v gi?m nguy c? b?nh tim    S? d?ng thu?c l Ti th?c s? khuyn b?n nn b? thu?c l

## 2020-06-12 NOTE — Progress Notes (Signed)
Subjective: Chief Complaint  Patient presents with  . Follow-up    headache and toe pain sometimes-pain resolves with massage    Here for f/u. Here with interpreter from [facility] [place]  Last visit we changed to different BP medication, Amlodipine 5mg .   Home readings can be SBP 99 - 120.  For the most part feeling fine, no chest pain, no dizziness. He is having some tingling feeling in head and headache occasionally since last visit.  Sometimes gets burning sensation in toes.    When he has headache can get sweats.   He is a smoker.  Was 1ppd, but lately about 1/2 pack to 10 cigarettes daily.  He occasionally feels unusual sensation in chest.  No DOE, no chest pain necessarily worse with activity.  No LE edema  No family hx/o heart attack or stroke.    He is taking Gabapentin for tingling in extremities, not sure it is helping a lot.  Tingling mainly in toes bilat.  Been having some night sweats for a few months.  No wheezing or SOB, no rash.  No other aggravating or relieving factors. No other complaint.   Past Medical History:  Diagnosis Date  . Hypertension     Current Outpatient Medications on File Prior to Visit  Medication Sig Dispense Refill  . amLODipine (NORVASC) 5 MG tablet Take 1 tablet (5 mg total) by mouth daily. 90 tablet 3  . gabapentin (NEURONTIN) 100 MG capsule Take 2 capsules (200 mg total) by mouth at bedtime. 60 capsule 1   No current facility-administered medications on file prior to visit.   ROS as in subjective   Objective: BP 130/88   Pulse 87   Ht 5' (1.524 m)   Wt 130 lb (59 kg)   SpO2 99%   BMI 25.39 kg/m    General appearence: alert, no distress, WD/WN Neck: supple, no lymphadenopathy, no thyromegaly, no masses, no bruits Heart: RRR, normal S1, S2, no murmurs Lungs: CTA bilaterally, no wheezes, rhonchi, or rales Extremities: no edema, no cyanosis, no clubbing Pulses: 2+ symmetric, upper and lower extremities, normal cap  refill Neurological: alert, oriented x 3, CN2-12 intact, strength normal upper extremities and lower extremities, sensation normal throughout, DTRs 2+ throughout, no cerebellar signs, gait normal Psychiatric: normal affect, behavior normal, pleasant     EKG Indication hypertension, risk factors for heart disease Rate 74 bpm, PR 156 ms, QRS 80 ms, QTC 397 ms, axis 53 degrees, normal sinus rhythm, no acute change   Assessment: Encounter Diagnoses  Name Primary?  . Essential hypertension, benign Yes  . Tobacco use   . Tingling sensation   . Screening for heart disease   . Mixed dyslipidemia   . Numbness   . Low mean corpuscular volume (MCV)   . Night sweat      Plan: Hypertension-blood pressure looks improved on amlodipine 5 mg but he notes some low readings at home and some unusual symptoms.  Pending labs we may end up backing off the amlodipine 2.5 mg daily  Advise he quit smoking.  He is trying to cut back.  Mixed dyslipidemia-restart statin  Numbness, tingling-labs today, consider head CT  EKG reviewed, discussed risk for heart disease including high blood pressure, smoker, mixed dyslipidemia, close to age 6  Instructions printed in [place]  Night sweats concern brought up at end of visit.  Consider CXR, quantiferon gold, unable to draw this lab today.  Caron was seen today for follow-up.  Diagnoses and all  orders for this visit:  Essential hypertension, benign -     EKG 12-Lead -     Comprehensive metabolic panel -     TSH  Tobacco use -     EKG 12-Lead  Tingling sensation -     EKG 12-Lead -     CBC with Differential/Platelet -     Comprehensive metabolic panel -     Iron -     Vitamin B12 -     TSH  Screening for heart disease -     EKG 12-Lead  Mixed dyslipidemia -     EKG 12-Lead  Numbness -     CBC with Differential/Platelet -     Comprehensive metabolic panel -     Iron -     Vitamin B12 -     TSH  Low mean corpuscular volume  (MCV) -     CBC with Differential/Platelet -     Iron -     Vitamin B12  Night sweat  Other orders -     atorvastatin (LIPITOR) 20 MG tablet; Take 1 tablet (20 mg total) by mouth daily.  f/u pending labs

## 2020-06-13 ENCOUNTER — Encounter: Payer: Self-pay | Admitting: Nurse Practitioner

## 2020-06-13 ENCOUNTER — Telehealth: Payer: Self-pay | Admitting: Nurse Practitioner

## 2020-06-13 ENCOUNTER — Other Ambulatory Visit: Payer: Self-pay

## 2020-06-13 DIAGNOSIS — R718 Other abnormality of red blood cells: Secondary | ICD-10-CM

## 2020-06-13 LAB — CBC WITH DIFFERENTIAL/PLATELET
Basophils Absolute: 0.1 10*3/uL (ref 0.0–0.2)
Basos: 1 %
EOS (ABSOLUTE): 0.2 10*3/uL (ref 0.0–0.4)
Eos: 2 %
Hematocrit: 51.8 % — ABNORMAL HIGH (ref 37.5–51.0)
Hemoglobin: 15.8 g/dL (ref 13.0–17.7)
Immature Grans (Abs): 0 10*3/uL (ref 0.0–0.1)
Immature Granulocytes: 0 %
Lymphocytes Absolute: 2.9 10*3/uL (ref 0.7–3.1)
Lymphs: 32 %
MCH: 22.3 pg — ABNORMAL LOW (ref 26.6–33.0)
MCHC: 30.5 g/dL — ABNORMAL LOW (ref 31.5–35.7)
MCV: 73 fL — ABNORMAL LOW (ref 79–97)
Monocytes Absolute: 0.8 10*3/uL (ref 0.1–0.9)
Monocytes: 9 %
Neutrophils Absolute: 5.1 10*3/uL (ref 1.4–7.0)
Neutrophils: 56 %
Platelets: 205 10*3/uL (ref 150–450)
RBC: 7.07 x10E6/uL (ref 4.14–5.80)
RDW: 17.3 % — ABNORMAL HIGH (ref 11.6–15.4)
WBC: 9.1 10*3/uL (ref 3.4–10.8)

## 2020-06-13 LAB — COMPREHENSIVE METABOLIC PANEL
ALT: 17 IU/L (ref 0–44)
AST: 31 IU/L (ref 0–40)
Albumin/Globulin Ratio: 1.6 (ref 1.2–2.2)
Albumin: 5 g/dL — ABNORMAL HIGH (ref 3.8–4.9)
Alkaline Phosphatase: 62 IU/L (ref 48–121)
BUN/Creatinine Ratio: 13 (ref 9–20)
BUN: 12 mg/dL (ref 6–24)
Bilirubin Total: 0.7 mg/dL (ref 0.0–1.2)
CO2: 22 mmol/L (ref 20–29)
Calcium: 9.8 mg/dL (ref 8.7–10.2)
Chloride: 99 mmol/L (ref 96–106)
Creatinine, Ser: 0.94 mg/dL (ref 0.76–1.27)
GFR calc Af Amer: 106 mL/min/{1.73_m2} (ref >59)
GFR calc non Af Amer: 92 mL/min/{1.73_m2} (ref >59)
Globulin, Total: 3.2 g/dL (ref 1.5–4.5)
Glucose: 76 mg/dL (ref 65–99)
Potassium: 4.5 mmol/L (ref 3.5–5.2)
Sodium: 140 mmol/L (ref 134–144)
Total Protein: 8.2 g/dL (ref 6.0–8.5)

## 2020-06-13 LAB — IRON: Iron: 148 ug/dL (ref 38–169)

## 2020-06-13 LAB — TSH: TSH: 1.82 u[IU]/mL (ref 0.450–4.500)

## 2020-06-13 LAB — VITAMIN B12: Vitamin B-12: 864 pg/mL (ref 232–1245)

## 2020-06-13 NOTE — Telephone Encounter (Signed)
Received a new hem referral from [facility] for elevated red blood cell count. Mr. Lamadrid has been scheduled to see Lacie on 6/28 at 1:45pm. Letter mailed to the pt and referring office notified.

## 2020-06-23 ENCOUNTER — Other Ambulatory Visit: Payer: Self-pay

## 2020-06-23 ENCOUNTER — Inpatient Hospital Stay: Payer: 59 | Attending: Nurse Practitioner | Admitting: Nurse Practitioner

## 2020-06-23 ENCOUNTER — Inpatient Hospital Stay: Payer: 59

## 2020-06-23 ENCOUNTER — Encounter: Payer: Self-pay | Admitting: Nurse Practitioner

## 2020-06-23 VITALS — BP 119/88 | HR 89 | Temp 97.6°F | Resp 18 | Ht 60.0 in | Wt 129.9 lb

## 2020-06-23 DIAGNOSIS — I10 Essential (primary) hypertension: Secondary | ICD-10-CM | POA: Insufficient documentation

## 2020-06-23 DIAGNOSIS — R718 Other abnormality of red blood cells: Secondary | ICD-10-CM

## 2020-06-23 DIAGNOSIS — G629 Polyneuropathy, unspecified: Secondary | ICD-10-CM | POA: Insufficient documentation

## 2020-06-23 DIAGNOSIS — G4730 Sleep apnea, unspecified: Secondary | ICD-10-CM | POA: Diagnosis not present

## 2020-06-23 DIAGNOSIS — Z79899 Other long term (current) drug therapy: Secondary | ICD-10-CM | POA: Insufficient documentation

## 2020-06-23 DIAGNOSIS — F17210 Nicotine dependence, cigarettes, uncomplicated: Secondary | ICD-10-CM | POA: Insufficient documentation

## 2020-06-23 DIAGNOSIS — R61 Generalized hyperhidrosis: Secondary | ICD-10-CM | POA: Diagnosis not present

## 2020-06-23 DIAGNOSIS — D751 Secondary polycythemia: Secondary | ICD-10-CM | POA: Diagnosis not present

## 2020-06-23 DIAGNOSIS — E785 Hyperlipidemia, unspecified: Secondary | ICD-10-CM | POA: Insufficient documentation

## 2020-06-23 LAB — CBC WITH DIFFERENTIAL (CANCER CENTER ONLY)
Abs Immature Granulocytes: 0.02 10*3/uL (ref 0.00–0.07)
Basophils Absolute: 0.1 10*3/uL (ref 0.0–0.1)
Basophils Relative: 1 %
Eosinophils Absolute: 0.2 10*3/uL (ref 0.0–0.5)
Eosinophils Relative: 2 %
HCT: 46.4 % (ref 39.0–52.0)
Hemoglobin: 14.2 g/dL (ref 13.0–17.0)
Immature Granulocytes: 0 %
Lymphocytes Relative: 26 %
Lymphs Abs: 2.1 10*3/uL (ref 0.7–4.0)
MCH: 22.2 pg — ABNORMAL LOW (ref 26.0–34.0)
MCHC: 30.6 g/dL (ref 30.0–36.0)
MCV: 72.4 fL — ABNORMAL LOW (ref 80.0–100.0)
Monocytes Absolute: 0.7 10*3/uL (ref 0.1–1.0)
Monocytes Relative: 9 %
Neutro Abs: 4.8 10*3/uL (ref 1.7–7.7)
Neutrophils Relative %: 62 %
Platelet Count: 164 10*3/uL (ref 150–400)
RBC: 6.41 MIL/uL — ABNORMAL HIGH (ref 4.22–5.81)
RDW: 16.3 % — ABNORMAL HIGH (ref 11.5–15.5)
WBC Count: 7.8 10*3/uL (ref 4.0–10.5)
nRBC: 0 % (ref 0.0–0.2)

## 2020-06-23 NOTE — Progress Notes (Addendum)
[facility]  [phone] [phone]  Clinic New consult Note   Patient Care Team: Tysinger, Cleda Mccreedy as PCP - General (Family Medicine) Malachy Mood, MD as Consulting Physician (Hematology) Date of Service: 06/23/2020   CHIEF COMPLAINTS/PURPOSE OF CONSULTATION:  Elevated red blood cell count, referred by Crosby Oyster, PA-C  HISTORY OF PRESENTING ILLNESS:  David Shepherd 54 y.o. male is here because of elevated RBC. He was found to have abnormal CBC from 06/13/2018 with elevated RBC 6.34, low MCV 74, low MCH 24.1, and elevated RDW 15.6. Hemoglobin has been normal in 15 range. He was referred to [place] after PCP noticed RBC 7.07 on 06/12/2020 with HCT 51.8. He was told to donate blood but did not. MCV and MCH remained low. He has no other cytopenias. CMP normal since 05/2018. On 06/12/2020, iron normal at 148, Vitamin B12 normal at 846, TSH normal 1.820. He thinks he may have developed this condition years ago (2016?) in [place].   PMH is significant for polyneuropathy, on gabapentin, HTN, and HL. He is a smoker 1 pack over 3 days since age 89 (40 years). He is trying to quit. Denies alcohol or drug use. He snores but has not had formal sleep study. Denies h/o thrombosis or heard disease. Lives with his wife, 1 daughter, and brother's family (wife and 2 children). He does not work. He is independent with ADLs. Denies family history of blood disorder or cancer. His mother died of unknown causes. His father was poisoned by police in [place].   Today, he presents with interpreter. He is fatigued but functional. Appetite is normal, he is gaining weight. Has occasional night sweats for 3-4 months. Denies fever or chills. He has mild exertional dyspnea, no cough or chest pain. He has tingling on the top and sides of his head and toes at night. This started a couple months ago. Denies pain.    MEDICAL HISTORY:  Past Medical History:  Diagnosis Date   Hypertension      SURGICAL HISTORY: Past Surgical History:  Procedure Laterality Date   NO PAST SURGERIES  11/2018    SOCIAL HISTORY: Social History   Socioeconomic History   Marital status: Married    Spouse name: Not on file   Number of children: Not on file   Years of education: Not on file   Highest education level: Not on file  Occupational History   Not on file  Tobacco Use   Smoking status: Current Every Day Smoker    Packs/day: 1.00    Years: 35.00    Pack years: 35.00    Types: Cigarettes   Smokeless tobacco: Never Used  Building services engineer Use: Never used  Substance and Sexual Activity   Alcohol use: Not Currently    Comment: occasional   Drug use: Not Currently   Sexual activity: Not on file  Other Topics Concern   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   Social Determinants of Health   Financial Resource Strain:    Difficulty of Paying Living Expenses:   Food Insecurity:    Worried About Programme researcher, broadcasting/film/video in the Last Year:    Barista in the Last Year:   Transportation Needs:    Freight forwarder (Medical):    Lack of Transportation (Non-Medical):   Physical Activity:    Days of Exercise per Week:  Minutes of Exercise per Session:   Stress:    Feeling of Stress :   Social Connections:    Frequency of Communication with Friends and Family:    Frequency of Social Gatherings with Friends and Family:    Attends Religious Services:    Active Member of Clubs or Organizations:    Attends Engineer, structural:    Marital Status:   Intimate Partner Violence:    Fear of Current or Ex-Partner:    Emotionally Abused:    Physically Abused:    Sexually Abused:     FAMILY HISTORY: Family History  Family history unknown: Yes    ALLERGIES:  is allergic to amlodipine and hctz [hydrochlorothiazide].  MEDICATIONS:  Current Outpatient Medications   Medication Sig Dispense Refill   amLODipine (NORVASC) 5 MG tablet Take 1 tablet (5 mg total) by mouth daily. 90 tablet 3   atorvastatin (LIPITOR) 20 MG tablet Take 1 tablet (20 mg total) by mouth daily. 90 tablet 3   gabapentin (NEURONTIN) 100 MG capsule TAKE 2 CAPSULES(200 MG) BY MOUTH AT BEDTIME 60 capsule 1   No current facility-administered medications for this visit.    REVIEW OF SYSTEMS:   Constitutional: Denies fevers, chills, weight loss (+) night sweats (+) fatigue  Eyes: Denies blurriness of vision, double vision or watery eyes Ears, nose, mouth, throat, and face: Denies mucositis or sore throat Respiratory: Denies cough  or wheezes (+) mild DOE  Cardiovascular: Denies palpitation, chest discomfort or lower extremity swelling Gastrointestinal:  Denies nausea, heartburn or change in bowel habits Skin: Denies abnormal skin rashes Lymphatics: Denies new lymphadenopathy or easy bruising Neurological:Denies numbness  or new weaknesses (+) tingling head, toes Behavioral/Psych: Mood is stable, no new changes  All other systems were reviewed with the patient and are negative.  PHYSICAL EXAMINATION:  Vitals:   06/23/20 1404  BP: 119/88  Pulse: 89  Resp: 18  Temp: 97.6 F (36.4 C)  SpO2: 99%   Filed Weights   06/23/20 1404  Weight: 129 lb 14.4 oz (58.9 kg)    GENERAL:alert, no distress and comfortable SKIN: no rash to exposed skin EYES:  sclera clear NECK: without mass LYMPH:  no palpable cervical or supraclavicular lymphadenopathy  LUNGS: clear with normal breathing effort HEART: regular rate & rhythm, no lower extremity edema ABDOMEN:abdomen soft, non-tender and normal bowel sounds PSYCH: alert & oriented x 3 with fluent speech NEURO: no focal motor deficits  LABORATORY DATA:  I have reviewed the data as listed CBC Latest Ref Rng & Units 06/23/2020 06/12/2020 03/07/2019  WBC 4.0 - 10.5 K/uL 7.8 9.1 9.7  Hemoglobin 13.0 - 17.0 g/dL 01.6 01.0 93.2  Hematocrit 39  - 52 % 46.4 51.8(H) 49.7  Platelets 150 - 400 K/uL 164 205 234    CMP Latest Ref Rng & Units 06/12/2020 03/07/2019 12/22/2018  Glucose 65 - 99 mg/dL 76 82 69  BUN 6 - 24 mg/dL 12 15 11   Creatinine 0.76 - 1.27 mg/dL 3.55 7.32  Sodium 134 - 144 mmol/L 140 138 141  Potassium 3.5 - 5.2 mmol/L 4.5 4.4 4.1  Chloride 96 - 106 mmol/L 99 101 102  CO2 20 - 29 mmol/L 22 22 23   Calcium 8.7 - 10.2 mg/dL 9.8 9.5 9.5  Total Protein 6.0 - 8.5 g/dL 8.2 7.6 7.9  Total Bilirubin 0.0 - 1.2 mg/dL 0.7 0.5 0.6  Alkaline Phos 48 - 121 IU/L 62 48 60  AST 0 - 40 IU/L 31 27 25   ALT  0 - 44 IU/L 17 19 18      RADIOGRAPHIC STUDIES: I have personally reviewed the radiological images as listed and agreed with the findings in the report. No results found.  ASSESSMENT & PLAN: 54 yo male   1. Polycythemia  -we reviewed his medical record and labs in detail. -He has mildly elevated RBC in 6.3 - 6.85 range since 05/2018 when his Epic record begins, he had intermittently elevated HCT up to 51.6 since then with normal Hgb. MCV chronically low 72-74. No WBC or plt abnormalities   -He was referred to 06/2018 when RBC increased to 7.07, HCT 51.8 on 06/12/20. -we reviewed the nature of polycythemia and related causes.  -he is a smoker for 40 years, and snores; we discussed this could be secondary polycythemia due to smoking and/or sleep apnea. He has been referred to pulmonology for sleep study.  -repeat labs today shows normal erythropoietin, normal iron. Elevated ferritin to 809. CBC shows RBC 6.41 which is his baseline, normal HCT 46.4, low MCV 72 and elevated RDW 16.3.  -JAK2 is pending to r/o PV, but due to mild nature and normal HCT this is not likely.  -he is not iron deficient. For low MCV we obtained hgb electrophoresis to r/o thalassemia.  -We will determine f/u pending final lab results.   2. Neuropathy -occurs on his head and toes, unclear etiology. Not a diabetic. -managed with gabapentin per  PCP  PLAN: -Medical record and labs reviewed -Referral to pulm for sleep study to r/o apnea  -Possibly secondary polycythemia related to smoking and/or ? Sleep apnea, counseled on smoking cessation -JAK2 to r/o PV, pending -F/u pending final lab results   No problem-specific Assessment & Plan notes found for this encounter.  Orders Placed This Encounter  Procedures   CBC with Differential (Cancer Center Only)    Standing Status:   Standing    Number of Occurrences:   5    Standing Expiration Date:   06/23/2021   Iron and TIBC    Standing Status:   Standing    Number of Occurrences:   5    Standing Expiration Date:   06/23/2021   Ferritin    Standing Status:   Standing    Number of Occurrences:   5    Standing Expiration Date:   06/23/2021   JAK2 (INCLUDING V617F AND EXON 12), MPL,& CALR W/RFL MPN PANEL (NGS)    Standing Status:   Future    Number of Occurrences:   1    Standing Expiration Date:   06/23/2021   Hgb Fractionation Cascade    Standing Status:   Future    Number of Occurrences:   1    Standing Expiration Date:   06/23/2021   Erythropoietin    Standing Status:   Future    Number of Occurrences:   1    Standing Expiration Date:   06/23/2021   Ambulatory referral to Pulmonology    Referral Priority:   Routine    Referral Type:   Consultation    Referral Reason:   Specialty Services Required    Requested Specialty:   Pulmonary Disease    Number of Visits Requested:   1    All questions were answered. The patient knows to call the clinic with any problems, questions or concerns.     06/25/2021, NP 06/23/2020  Addendum  I have seen the patient, examined him. I agree with the assessment and and plan and have  edited the notes.   I have reviewed his previous lab. Pt has mild elevated H/H, history of heavy smoking, and possible sleep apnea, likely secondary polycythemia.  I will refer him to pulmonary for sleep study.  Will obtain lab today to ruled out  polycythemia vera.  He also has low MCV, without evidence of iron deficiency, will obtain hemoglobin electrophoresis to rule out thalassemia.  If the above lab work is unremarkable, then no further work-up or treatment is needed.  I strongly encouraged him to quit smoking completely.  Will call his daughter when lab results return, and decide if he needs f/u with us.  Malachy MoodYan Feng  06/23/2020

## 2020-06-24 ENCOUNTER — Encounter: Payer: Self-pay | Admitting: Medical

## 2020-06-24 ENCOUNTER — Encounter: Payer: Self-pay | Admitting: Nurse Practitioner

## 2020-06-24 LAB — IRON AND TIBC
Iron: 94 ug/dL (ref 42–163)
Saturation Ratios: 43 % (ref 20–55)
TIBC: 219 ug/dL (ref 202–409)
UIBC: 125 ug/dL (ref 117–376)

## 2020-06-24 LAB — FERRITIN: Ferritin: 809 ng/mL — ABNORMAL HIGH (ref 24–336)

## 2020-06-24 LAB — ERYTHROPOIETIN: Erythropoietin: 10.8 m[IU]/mL (ref 2.6–18.5)

## 2020-06-26 LAB — HGB FRACTIONATION BY HPLC
Hgb A: 97.8 % (ref 96.4–98.8)
Hgb C: 0 %
Hgb E: 0 %
Hgb F: 0 % (ref 0.0–2.0)
Hgb S: 0 %
Hgb Variant: 0.6 % — ABNORMAL HIGH

## 2020-06-26 LAB — HGB FRACTIONATION CASCADE: Hgb A2: 1.6 % — ABNORMAL LOW (ref 1.8–3.2)

## 2020-06-27 ENCOUNTER — Encounter: Payer: Self-pay | Admitting: Pulmonary Disease

## 2020-07-05 ENCOUNTER — Ambulatory Visit: Payer: 59 | Attending: Internal Medicine

## 2020-07-05 DIAGNOSIS — Z23 Encounter for immunization: Secondary | ICD-10-CM

## 2020-07-05 NOTE — Progress Notes (Signed)
   Covid-19 Vaccination Clinic  Name:  FARRELL PANTALEO    MRN: [id number] DOB: [birth year]  07/05/2020  Mr. Holbein was observed post Covid-19 immunization for 15 minutes without incident. He was provided with Vaccine Information Sheet and instruction to access the V-Safe system.   Mr. Doolen was instructed to call 911 with any severe reactions post vaccine: Marland Kitchen Difficulty breathing  . Swelling of face and throat  . A fast heartbeat  . A bad rash all over body  . Dizziness and weakness   Immunizations Administered    Name Date Dose VIS Date Route   Pfizer COVID-19 Vaccine 07/05/2020 10:43 AM 0.3 mL 02/20/2019 Intramuscular   Manufacturer: [employer], [employer]   Lot: [id number]   NDC: [address]-6

## 2020-07-08 LAB — JAK2 (INCLUDING V617F AND EXON 12), MPL,& CALR W/RFL MPN PANEL (NGS)

## 2020-07-11 ENCOUNTER — Other Ambulatory Visit: Payer: Self-pay | Admitting: Medical

## 2020-07-11 NOTE — Telephone Encounter (Signed)
[facility] is requesting to fill pt gabapentin. Please advise KH 

## 2020-07-16 ENCOUNTER — Other Ambulatory Visit: Payer: Self-pay | Admitting: Medical

## 2020-07-16 NOTE — Progress Notes (Signed)
I will try again with the interpretor tomorrow 07/17/20 I did call several times today and they are having trouble getting someone

## 2020-07-16 NOTE — Telephone Encounter (Signed)
[facility] is requesting to fill pt gabapentin. Please advise Kh

## 2020-07-17 ENCOUNTER — Telehealth: Payer: Self-pay

## 2020-07-17 NOTE — Telephone Encounter (Signed)
Several attempts made to secure interpretor line/assistance I called this morning and pt put a niece on phone with him to receive update in addition pt was encouraged to quit smoking and family member states he is working on this and doing well pt understands update and has no questions at this time         Mable Fill, NP  Susanne Greenhouse, LPN Cc: Aleen Campi, Irving Shows,  Please call patient and let him know Dr. Mosetta Putt and I reviewed his labs. You may need to go through an interpreter. JAK2 is normal, he does not have primary bone marrow disease polycythemia vera (PV). 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 This is not causing anemia and does not have much clinical significance at this time. He does not need any treatment for this.   His elevated RBC is likely secondary to his cigarette smoking, we encourage him to quit. He can follow up with PCP Crosby Oyster, PA going forward, I have attached him to this note. We will see him if needed in the future.   Onalee Hua, thank you for this referral.   Thanks,  Clayborn Heron, NP

## 2020-07-26 ENCOUNTER — Ambulatory Visit: Payer: 59 | Attending: Internal Medicine

## 2020-07-26 DIAGNOSIS — Z23 Encounter for immunization: Secondary | ICD-10-CM

## 2020-07-26 NOTE — Progress Notes (Signed)
   Covid-19 Vaccination Clinic  Name:  RAKIM MOONE    MRN: [id number] DOB: [birth year]  07/26/2020  Mr. Sollenberger was observed post Covid-19 immunization for 15 minutes without incident. He was provided with Vaccine Information Sheet and instruction to access the V-Safe system.   Mr. Jankowski was instructed to call 911 with any severe reactions post vaccine: Marland Kitchen Difficulty breathing  . Swelling of face and throat  . A fast heartbeat  . A bad rash all over body  . Dizziness and weakness   Immunizations Administered    Name Date Dose VIS Date Route   Pfizer COVID-19 Vaccine 07/26/2020  9:58 AM 0.3 mL 02/20/2019 Intramuscular   Manufacturer: [employer], [employer]   Lot: [id number]   NDC: [address]-5

## 2020-08-15 ENCOUNTER — Other Ambulatory Visit: Payer: Self-pay

## 2020-08-15 ENCOUNTER — Encounter: Payer: Self-pay | Admitting: Pulmonary Disease

## 2020-08-15 ENCOUNTER — Ambulatory Visit (INDEPENDENT_AMBULATORY_CARE_PROVIDER_SITE_OTHER): Payer: 59 | Admitting: Pulmonary Disease

## 2020-08-15 VITALS — BP 112/72 | HR 78 | Temp 97.3°F | Ht 60.0 in | Wt 129.6 lb

## 2020-08-15 DIAGNOSIS — G4733 Obstructive sleep apnea (adult) (pediatric): Secondary | ICD-10-CM

## 2020-08-15 NOTE — Progress Notes (Signed)
David Shepherd    [id number]    [birth year]  Primary Care Physician:Tysinger, Kermit Balo, PA-C  Referring Physician: Jac Canavan, PA-C [address] [place],  [place] [address]  Chief complaint:   Patient was seen with the assistance of an interpreter Being seen for daytime sleepiness and snoring  HPI:  Told multiple times about snoring by spouse Spouse wakes him up sometimes from snoring  He does have daytime headaches No witnessed apneas Wakes up with a dry mouth Sleep is not always restorative  Admits to night sweats  Usually goes to bed about 9 PM, takes him about 30 minutes to fall asleep About 1 awakening 6 AM this is wake up time  Weight has been relatively stable recently  No family history of obstructive sleep apnea known to the patient  An active smoker, half a pack a day  Blood pressure is well controlled with medications  Outpatient Encounter Medications as of 08/15/2020  Medication Sig  . amLODipine (NORVASC) 5 MG tablet Take 1 tablet (5 mg total) by mouth daily.  Marland Kitchen atorvastatin (LIPITOR) 20 MG tablet Take 1 tablet (20 mg total) by mouth daily.  Marland Kitchen gabapentin (NEURONTIN) 100 MG capsule TAKE 2 CAPSULES(200 MG) BY MOUTH AT BEDTIME   No facility-administered encounter medications on file as of 08/15/2020.    Allergies as of 08/15/2020 - Review Complete 08/15/2020  Allergen Reaction Noted  . Amlodipine Palpitations 05/02/2019  . Hctz [hydrochlorothiazide] Other (See Comments) 05/02/2019    Past Medical History:  Diagnosis Date  . Hypertension     Past Surgical History:  Procedure Laterality Date  . NO PAST SURGERIES  11/2018    Family History  Family history unknown: Yes    Social History   Socioeconomic History  . Marital status: Married    Spouse name: Not on file  . Number of children: Not on file  . Years of education: Not on file  . Highest education level: Not on file  Occupational History  . Not on file  Tobacco Use   . Smoking status: Current Every Day Smoker    Packs/day: 1.00    Years: 35.00    Pack years: 35.00    Types: Cigarettes  . Smokeless tobacco: Never Used  Vaping Use  . Vaping Use: Never used  Substance and Sexual Activity  . Alcohol use: Not Currently    Comment: occasional  . Drug use: Not Currently  . Sexual activity: Not on file  Other Topics Concern  . 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   Social Determinants of Health   Financial Resource Strain:   . Difficulty of Paying Living Expenses: Not on file  Food Insecurity:   . Worried About Programme researcher, broadcasting/film/video in the Last Year: Not on file  . Ran Out of Food in the Last Year: Not on file  Transportation Needs:   . Lack of Transportation (Medical): Not on file  . Lack of Transportation (Non-Medical): Not on file  Physical Activity:   . Days of Exercise per Week: Not on file  . Minutes of Exercise per Session: Not on file  Stress:   . Feeling of Stress : Not on file  Social Connections:   . Frequency of Communication with Friends and Family: Not on file  . Frequency of Social Gatherings with Friends and Family: Not on file  . Attends Religious  Services: Not on file  . Active Member of Clubs or Organizations: Not on file  . Attends Banker Meetings: Not on file  . Marital Status: Not on file  Intimate Partner Violence:   . Fear of Current or Ex-Partner: Not on file  . Emotionally Abused: Not on file  . Physically Abused: Not on file  . Sexually Abused: Not on file    Review of Systems  Constitutional: Positive for fatigue.  Respiratory: Negative for shortness of breath.   Cardiovascular: Negative for chest pain.  Psychiatric/Behavioral: Positive for sleep disturbance.    Vitals:   08/15/20 0926  BP: 112/72  Pulse: 78  Temp: (!) 97.3 F (36.3 C)  SpO2: 100%     Physical Exam Constitutional:      Appearance: Normal appearance.    HENT:     Head: Normocephalic and atraumatic.     Mouth/Throat:     Mouth: Mucous membranes are moist.     Comments: Crowded oropharynx, Mallampati 3 Eyes:     Pupils: Pupils are equal, round, and reactive to light.  Cardiovascular:     Rate and Rhythm: Normal rate and regular rhythm.     Heart sounds: No murmur heard.  No friction rub.  Pulmonary:     Effort: Pulmonary effort is normal. No respiratory distress.     Breath sounds: Normal breath sounds. No stridor. No wheezing or rhonchi.  Musculoskeletal:     Cervical back: No rigidity or tenderness.  Skin:    General: Skin is warm.  Neurological:     General: No focal deficit present.     Mental Status: He is alert.  Psychiatric:        Mood and Affect: Mood normal.   Epworth Sleepiness Scale is unremarkable  Assessment:  Moderate probability of significant obstructive sleep apnea  Hypersomnolence  Headaches -May be related to untreated obstructive sleep apnea and nocturnal desaturations  Hypertension -Adequately controlled with medications at present  Pathophysiology of sleep disordered breathing discussed with the patient Treatment options for sleep disordered breathing discussed with the patient  Plan/Recommendations: Schedule patient for home sleep study  Risks with not treating obstructive sleep apnea discussed with the patient  Follow-up in 3 months  Encouraged to call with any significant concerns   Virl Diamond MD [facility] 08/15/2020, 9:30 AM  CC: Jac Canavan, PA-C

## 2020-08-15 NOTE — Patient Instructions (Signed)
Moderate probability of significant obstructive sleep apnea  We will schedule you for home sleep study We will update you with results as soon as reviewed  Follow-up about a month following initiation of CPAP treatment  Call with significant concerns S?ng cng v?i ng?ng th? khi ng? Living With Sleep Apnea Ng?ng th?? khi ngu? la? m?t ti?nh tra?ng trong ? c ng??ng th? ho??c th? nng trong lu?c ngu?[place] nhn ph? bi?n nh?t gy ng?ng th? khi ng? l ????ng th?? bi? xe?p ho??c b? t??c. Nh?ng ng??i b? ng?ng th? khi ng? s? ngy to v c nh?ng lc th? h?n h?n v ng?ng th? trong 10 giy ho?c lu h?n trong khi ng?. Tnh tr?ng ny x?y ra l?p ?i l?p l?i su?t ?m. ?i?u ny lm gin ?o?n gi?c ng? v khi?n c? th? khng ???c ngh? ng?i c?n thi?t, ?i?u ? c th? gy m?t m?i v thi?u n?ng l??ng (m?t m?i) trong ngy. Ng?ng th? c?ng lm gin ?o?n gi?c ng? su m qu v? c?n ?? c?m th?y ???c ngh? ng?i. Ngay c? khi qu v? khng th?c hon ton trong nh?ng lc th? h?n h?n, gi?c ng? c?a qu v? c th? khng ???c m ??m. Qu v? c?ng c th? b? ?au ??u vo bu?i sng v thi?u n?ng l??ng trong ngy v qu v? c th? c?m th?y lo l?ng ho?c chn n?n. Ng?ng th? khi ng? c th? ?nh h??ng nh? th? no ??n ti? Ng?ng th? khi ng? lm t?ng nguy c? b? m?t m?i qu m?c trong ngy (m?t m?i vo ban ngy). N c?ng c th? lm t?ng nguy c? b? cc tnh tr?ng b?nh l, ch?ng h?n nh?:  Nh?i mu c? tim.  ??t qu?Marland Kitchen  Ti?u ???ng.  Suy tim.  Nh?p tim khng ??u.  Huy?t p cao. N?u qu v? b? m?t m?i vo ban ngy do ng?ng th? khi ng?, qu v? d? b?:  Thnh tch km trong h?c hnh ho?c cng vi?c.  Ng? trong khi li xe.  Kh t?p trung ch .  B? tr?m ca?m ho??c lo u.  Th?a cn r?t nhi?u (bo ph).  R?i loa?n ch??c n?ng ti?nh du?c. Ti c th? th?c hi?n nh?ng hnh ??ng no ?? x? tr ng?ng th? khi ng?? ?i?u tr? ng?ng th? khi ng?   N?u quy? vi? ????c cho du?ng m?t du?ng cu? khai thng ????ng th?? khi quy? vi? ngu?, ha?y s?? du?ng  theo chi? d?n cu?a chuyn gia ch?m so?c s??c kho?e. Qu v? c th? ???c cho dng: ? Du?ng cu? ?? mi?ng. ?y la? m?t du?ng cu? ???t v??a v?n trong mi?ng ?? ??y ha?m d???i cu?a quy? vi? v? phi?a tr???c. ? Ma?y a?p su?t ????ng th?? d??ng lin tu?c (CPAP). Thi?t b? ny th?i kh qua m?t m?t n? khi qu v? th? ra. ? Ma?y a?p su?t ????ng th?? d??ng ?? thi? th?? ra (EPAP) qua m?i. Thi?t bi? na?y co? ca?c van ??t va?o m?i l? mu?i. ? Ma?y a?p su?t ????ng th?? d??ng hai thi? (BPAP). Thi?t b? ny th?i kh qua m?t m?t n? khi qu v? th? vo (ht vo) v th? ra.  Qu v? c th? c?n ???c ph?u thu?t n?u ca?c bi?n pha?p ?i?u tri? kha?c khng co? ta?c du?ng v?i qu v?. Thi quen ng?  ?i ng? v th?c d?y vo cng m?t th?i gian m?i ngy. ?i?u ny gip thi?t l?p ??ng h? c? th? (nh?p sinh h?c) c?a qu v? cho gi?c ng?. ? N?u qu v? v?n ?i ng? mu?n h?n  bnh th??ng, ch?ng h?n nh? vo cu?i tu?n, hy c? g?ng d?y vo bu?i sng trong vng 2 gi? sau th?i gian th?c d?y bnh th??ng c?a qu v?.  C? g??ng ngu? t nh?t 7-9 ti?ng m?i ?m.  D?ng s? d?ng my tnh, my tnh b?ng v ?i?n tho?i di ??ng m?t vi gi? tr??c khi ?i ng?.  Khng ch?p m?t trong th?i gian di vo ban ngy. N?u qu v? ch?p m?t, hy gi?i h?n th?i gian ny trong 30 pht.  C thi quen th? gin khi ?i ng?. ??c ho?c nghe nh?c c th? gip qu v? th? l?ng v d? ng?.  Ch? s? d?ng phng ng? ?? ng?. ? ?? tivi v my tnh bn ngoi phng ng?. ? Gi? cho phng ng? mt m?, t?i v yn t?nh. ? S? d?ng ??m v g?i nng ??.  Tun th? h??ng d?n c?a chuyn gia ch?m [place] s?c kh?e v? nh?ng thay ??i khc ??i v?i thi quen ng?Annell Greening d??ng  Khng ?n cc b?a ?n kh tiu vo bu?i t?i.  Khng dng caffeine vo cu?i ngy. Tc d?ng c?a caffeine c th? ko di h?n 5 gi?Noralee Stain theo h??ng d?n c?a chuyn gia ch?m [place] s?c kh?e ho?c chuyn gia dinh d??ng v? b?t k? thay ??i no v?i ch? ?? ?n. L?i s?ng      Khng u?ng r??u tr??c khi ?i ng?. R??u bia c th? khi?n qu v? ng?  lc ??u, nh?ng l?i khi?n qu v? th?c d?y vo gi?a ?m v kh ng? tr? l?i.  Khng s? d?ng b?t k? s?n ph?m no c nicotine ho?c thu?c l, ch?ng ha?n nh? thu?c l d?ng ht v thu?c l ?i?n t?. N?u qu v? c?n gip ?? ?? cai thu?c, hy h?i chuyn gia ch?m [place] s?c kh?e. Thu?c  Ch? s? d?ng thu?c khng k ??n v thu?c k ??n theo ch? d?n c?a chuyn gia ch?m [place] s?c kh?e.  Khng s? d?ng thu?c ng? khng k ??n. Qu v? c th? b? ph? thu?c vo thu?c ny v c th? khi?n ng?ng th? khi ng? tr?m tr?ng h?n.  Khng s? d?ng thu?c, ch?ng h?n nh? thu?c an th?n v ch?t gy nghi?n, tr? khi ???c chuyn gia ch?m [place] s?c kh?e ch? d?n. Ho?t ??ng  T?p th? d?c vo h?u h?t cc ngy, nh?ng trnh t?p th? d?c vo bu?i t?i. T?p th? d?c g?n gi? ?i ng? c th? ?nh h??ng ??n gi?c ng?.  N?u c th?, hy dnh th?i gian ngoi tr?i m?i ngy. nh sng t? nhin gip ?i?u ha nh?p sinh h?c c?a qu v?. Thng tin chung  Gi?m cn n?u qu v? c?n v duy tr cn n?ng t?t cho s?c kh?e.  Tun th? t?t c? cc l?n khm theo di theo ch? d?n c?a chuyn gia ch?m [place] s?c kh?e. ?i?u ny c vai tr quan tr?ng.  N?u qu v? ???c lm ph?u thu?t, hy ??m b?o cho chuyn gia ch?m [place] s?c kh?e bi?t r?ng qu v? b? ng?ng th? khi ng?. Qu v? c th? c?n mang theo thi?t b? c?a qu v?. N?i ?? tm thm thng tin Tm hi?u thm v? ng?ng th? khi ng? v m?t m?i ban ngy ?:  American Sleep Association (Hi?p h?i Gi?c ng? Hoa K?): sleepassociation.org  National Sleep Foundation Ladell Heads? Gi?c ng? Qu?c gia): sleepfoundation.org  National Heart, Lung, and Blood Institute (Vi?n Tim, Ph?i v Mu Qu?c gia): [employer]DryCleaner.gl Tm t?t  Ng?ng th? ng? c th? gy m?t m?i ban ngy v  cc tnh tr?ng s?c kh?e nghim tr?ng khc.  C? ng?ng th? khi ng? v m?t m?i ban ngy c th? khng t?t cho s?c kh?e v h?nh phc c?a qu v?.  Qu v? c th? c?n ph?i mang m?t thi?t b? khi ng? gip gi? cho ???ng th? khai thng.  N?u qu v? ???c lm ph?u thu?t, hy ??m b?o cho chuyn gia ch?m [place] s?c  kh?e bi?t r?ng qu v? b? ng?ng th? khi ng?. Qu v? c th? c?n mang theo thi?t b? c?a qu v?.  Thay ??i thi quen ng?, ch? ?? ?n, l?i s?ng v ho?t ??ng c th? gip qu v? x? tr ng?ng th? khi ng?Marland Kitchen Thng tin ny khng nh?m m?c ?ch thay th? cho l?i khuyn m chuyn gia ch?m [place] s?c kh?e ni v?i qu v?. Hy b?o ??m qu v? ph?i th?o lu?n b?t k? v?n ?? g m qu v? c v?i chuyn gia ch?m [place] s?c kh?e c?a qu v?. Document Revised: 09/12/2018 Document Reviewed: 09/12/2018 Elsevier Patient Education  2020 [employer].

## 2020-09-12 ENCOUNTER — Other Ambulatory Visit: Payer: Self-pay | Admitting: Medical

## 2020-09-12 NOTE — Telephone Encounter (Signed)
[facility] is requesting to fill pt gabapentin. Please advise KH 

## 2020-09-19 ENCOUNTER — Other Ambulatory Visit: Payer: Self-pay

## 2020-09-19 ENCOUNTER — Ambulatory Visit: Payer: 59

## 2020-09-19 DIAGNOSIS — G4733 Obstructive sleep apnea (adult) (pediatric): Secondary | ICD-10-CM

## 2020-09-19 DIAGNOSIS — G4710 Hypersomnia, unspecified: Secondary | ICD-10-CM

## 2020-10-02 ENCOUNTER — Telehealth: Payer: Self-pay | Admitting: Pulmonary Disease

## 2020-10-02 DIAGNOSIS — G4710 Hypersomnia, unspecified: Secondary | ICD-10-CM

## 2020-10-02 NOTE — Telephone Encounter (Signed)
Pt wiill come in on 11/13/2020 to go over sleep results in person will bring family member with him

## 2020-10-02 NOTE — Telephone Encounter (Signed)
Call patient  -Will need interpreter  Sleep study result  Date of study: 09/19/2020  Impression: Negative study for significant sleep disordered breathing No significant oxygen desaturations  Recommendation: Sleep position optimization by encouraging sleeping lateral position, elevating the head of the bed by about 30 degrees may help  regular exercises will help provide good sleep quality  An oral device may be considered as an option of treatment for snoring-this will entail referral to a dentist  Clinical monitoring is appropriate, an in lab study may be considered if there is persistent concern with nonrestorative sleep and daytime sleepiness

## 2020-11-13 ENCOUNTER — Other Ambulatory Visit: Payer: Self-pay | Admitting: Medical

## 2020-11-13 ENCOUNTER — Other Ambulatory Visit: Payer: Self-pay

## 2020-11-13 ENCOUNTER — Ambulatory Visit (INDEPENDENT_AMBULATORY_CARE_PROVIDER_SITE_OTHER): Payer: 59 | Admitting: Pulmonary Disease

## 2020-11-13 ENCOUNTER — Encounter: Payer: Self-pay | Admitting: Pulmonary Disease

## 2020-11-13 VITALS — BP 114/72 | HR 99 | Temp 97.2°F | Ht 60.0 in | Wt 132.2 lb

## 2020-11-13 DIAGNOSIS — R0683 Snoring: Secondary | ICD-10-CM

## 2020-11-13 NOTE — Patient Instructions (Signed)
I will see you as needed  Call with significant concerns   Your primary doctor can try you on a sleep aid if needed Ensure you get about 6 hours of sleep if you are using a sleep aid  Your sleep study was very very mild, no other testing needs done at present

## 2020-11-13 NOTE — Progress Notes (Signed)
David Shepherd    [id number]    05/30/1966  Primary Care Physician:Tysinger, Kermit Balo, PA-C  Referring Physician: Jac Canavan, PA-C [address] [place],  [place] [address]  Chief complaint:   Patient was seen with the assistance of an interpreter Recently had a sleep study  HPI:  Sleep study was negative for significant sleep apnea with an AHI of 3.3  Reports nonrestorative sleep Very light sleep  Wakes up easily Has shoulder pain and a lot of musculoskeletal pain which disturbs his sleep  Told multiple times about snoring by spouse Spouse wakes him up sometimes from snoring  He does have daytime headaches No witnessed apneas Wakes up with a dry mouth Sleep is not always restorative  Admits to night sweats  Usually goes to bed about 9 PM, takes him about 30 minutes to fall asleep About 1 awakening 6 AM this is wake up time  Weight has been relatively stable recently  No family history of obstructive sleep apnea known to the patient  An active smoker, half a pack a day  Blood pressure is well controlled with medications  Outpatient Encounter Medications as of 11/13/2020  Medication Sig  . amLODipine (NORVASC) 5 MG tablet Take 1 tablet (5 mg total) by mouth daily.  Marland Kitchen atorvastatin (LIPITOR) 20 MG tablet Take 1 tablet (20 mg total) by mouth daily.  Marland Kitchen gabapentin (NEURONTIN) 100 MG capsule TAKE 2 CAPSULES(200 MG) BY MOUTH AT BEDTIME   No facility-administered encounter medications on file as of 11/13/2020.    Allergies as of 11/13/2020 - Review Complete 11/13/2020  Allergen Reaction Noted  . Amlodipine Palpitations 05/02/2019  . Hctz [hydrochlorothiazide] Other (See Comments) 05/02/2019    Past Medical History:  Diagnosis Date  . Hypertension     Past Surgical History:  Procedure Laterality Date  . NO PAST SURGERIES  11/2018    Family History  Family history unknown: Yes    Social History   Socioeconomic History  . Marital  status: Married    Spouse name: Not on file  . Number of children: Not on file  . Years of education: Not on file  . Highest education level: Not on file  Occupational History  . Not on file  Tobacco Use  . Smoking status: Current Every Day Smoker    Packs/day: 1.00    Years: 35.00    Pack years: 35.00    Types: Cigarettes  . Smokeless tobacco: Never Used  . Tobacco comment: 1-2 packs smoked daily 11/13/20 [facility]   Vaping Use  . Vaping Use: Never used  Substance and Sexual Activity  . Alcohol use: Not Currently    Comment: occasional  . Drug use: Not Currently  . Sexual activity: Not on file  Other Topics Concern  . 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   Social Determinants of Health   Financial Resource Strain:   . Difficulty of Paying Living Expenses: Not on file  Food Insecurity:   . Worried About Programme researcher, broadcasting/film/video in the Last Year: Not on file  . Ran Out of Food in the Last Year: Not on file  Transportation Needs:   . Lack of Transportation (Medical): Not on file  . Lack of Transportation (Non-Medical): Not on file  Physical Activity:   . Days of Exercise per Week: Not on file  . Minutes of Exercise per Session: Not  on file  Stress:   . Feeling of Stress : Not on file  Social Connections:   . Frequency of Communication with Friends and Family: Not on file  . Frequency of Social Gatherings with Friends and Family: Not on file  . Attends Religious Services: Not on file  . Active Member of Clubs or Organizations: Not on file  . Attends Banker Meetings: Not on file  . Marital Status: Not on file  Intimate Partner Violence:   . Fear of Current or Ex-Partner: Not on file  . Emotionally Abused: Not on file  . Physically Abused: Not on file  . Sexually Abused: Not on file    Review of Systems  Constitutional: Positive for fatigue.  Respiratory: Negative for shortness of breath.     Cardiovascular: Negative for chest pain.  Psychiatric/Behavioral: Positive for sleep disturbance.    Vitals:   11/13/20 1005  BP: 114/72  Pulse: 99  Temp: (!) 97.2 F (36.2 C)  SpO2: 100%     Physical Exam Constitutional:      Appearance: Normal appearance.  HENT:     Head: Normocephalic and atraumatic.     Mouth/Throat:     Comments: Crowded oropharynx, Mallampati 3 Cardiovascular:     Rate and Rhythm: Normal rate and regular rhythm.     Heart sounds: No murmur heard.  No friction rub.  Pulmonary:     Effort: Pulmonary effort is normal. No respiratory distress.     Breath sounds: Normal breath sounds. No stridor. No wheezing or rhonchi.  Musculoskeletal:     Cervical back: No rigidity or tenderness.  Neurological:     Mental Status: He is alert.  Psychiatric:        Mood and Affect: Mood normal.   Epworth Sleepiness Scale is unremarkable  Assessment:   Patient with a recent negative study for significant sleep disordered breathing  Does have some hypersomnolence  Headaches  Poor sleep quality  Upon further discussion he has reasons for light sleep and nonrestorative sleep I did discuss possibility of an in lab study however I believe the likelihood of a positive study is low  We did discuss possibility of using a sleep aid Wants to discuss further with his primary doctor  Plan/Recommendations:  I will see him as needed  Sleep aid may be considered for poor sleep quality  Call with any significant concerns  An in lab study can be considered if there is any progression of symptoms  Virl Diamond MD [facility] 11/13/2020, 10:22 AM  CC: Jac Canavan, PA-C

## 2020-11-14 ENCOUNTER — Telehealth: Payer: Self-pay

## 2020-11-14 NOTE — Telephone Encounter (Signed)
Called pt. Unable to LM, reviewed all of his medications and he should still have refills on all of his medications.

## 2020-11-14 NOTE — Telephone Encounter (Signed)
Pt left message requesting refills

## 2020-11-17 NOTE — Telephone Encounter (Signed)
Adam called pt

## 2021-01-11 ENCOUNTER — Other Ambulatory Visit: Payer: Self-pay | Admitting: Medical

## 2021-03-14 ENCOUNTER — Other Ambulatory Visit: Payer: Self-pay | Admitting: Medical

## 2021-03-17 ENCOUNTER — Other Ambulatory Visit: Payer: Self-pay | Admitting: Medical

## 2021-04-16 ENCOUNTER — Telehealth: Payer: Self-pay | Admitting: Medical

## 2021-04-16 NOTE — Telephone Encounter (Signed)
Fall and depression was done for patient.

## 2021-05-15 ENCOUNTER — Other Ambulatory Visit: Payer: Self-pay | Admitting: Medical

## 2021-05-18 ENCOUNTER — Other Ambulatory Visit: Payer: Self-pay | Admitting: Medical

## 2021-05-18 ENCOUNTER — Telehealth: Payer: Self-pay | Admitting: Medical

## 2021-05-18 MED ORDER — AMLODIPINE BESYLATE 5 MG PO TABS: 5.0000 mg | ORAL_TABLET | Freq: Every day | ORAL | 0 refills | Status: DC

## 2021-05-18 MED ORDER — GABAPENTIN 100 MG PO CAPS: ORAL_CAPSULE | ORAL | 0 refills | Status: DC

## 2021-05-18 MED ORDER — ATORVASTATIN CALCIUM 20 MG PO TABS: 20.0000 mg | ORAL_TABLET | Freq: Every day | ORAL | 0 refills | Status: DC

## 2021-05-18 NOTE — Telephone Encounter (Signed)
Medications sent.  I believe he is due back for either physical or med check fasting.  Go ahead and set this up

## 2021-05-18 NOTE — Telephone Encounter (Signed)
Pt is requesting refill on Gabapentin sent to the [facility] [place] on [address]

## 2021-05-18 NOTE — Telephone Encounter (Signed)
Got pt schedule for Fasting CPE on 9/20

## 2021-08-14 ENCOUNTER — Other Ambulatory Visit: Payer: Self-pay | Admitting: Medical

## 2021-08-28 ENCOUNTER — Other Ambulatory Visit: Payer: Self-pay | Admitting: Medical

## 2021-08-28 MED ORDER — AMLODIPINE BESYLATE 5 MG PO TABS: 5.0000 mg | ORAL_TABLET | Freq: Every day | ORAL | 0 refills | Status: DC

## 2021-08-28 NOTE — Addendum Note (Signed)
Addended by: Herminio Commons A on: 08/28/2021 08:40 AM   Modules accepted: Orders

## 2021-08-28 NOTE — Telephone Encounter (Signed)
Pt states he does take medication and is allergic.

## 2021-08-28 NOTE — Telephone Encounter (Signed)
Left message for pt to call back.  Pt is overdue for an appt plus I see amlodipine is an allergy? Is he still taking this

## 2021-09-10 ENCOUNTER — Other Ambulatory Visit: Payer: Self-pay | Admitting: Medical

## 2021-09-15 ENCOUNTER — Ambulatory Visit: Payer: 59 | Admitting: Medical

## 2021-09-15 ENCOUNTER — Other Ambulatory Visit: Payer: Self-pay

## 2021-09-15 ENCOUNTER — Encounter: Payer: Self-pay | Admitting: Medical

## 2021-09-15 VITALS — BP 124/82 | HR 69 | Ht 60.0 in | Wt 130.4 lb

## 2021-09-15 DIAGNOSIS — Z0000 Encounter for general adult medical examination without abnormal findings: Secondary | ICD-10-CM | POA: Diagnosis not present

## 2021-09-15 DIAGNOSIS — H9113 Presbycusis, bilateral: Secondary | ICD-10-CM | POA: Diagnosis not present

## 2021-09-15 DIAGNOSIS — Z720 Tobacco use: Secondary | ICD-10-CM

## 2021-09-15 DIAGNOSIS — R519 Headache, unspecified: Secondary | ICD-10-CM

## 2021-09-15 DIAGNOSIS — I10 Essential (primary) hypertension: Secondary | ICD-10-CM

## 2021-09-15 DIAGNOSIS — H538 Other visual disturbances: Secondary | ICD-10-CM

## 2021-09-15 DIAGNOSIS — H532 Diplopia: Secondary | ICD-10-CM | POA: Insufficient documentation

## 2021-09-15 DIAGNOSIS — Z125 Encounter for screening for malignant neoplasm of prostate: Secondary | ICD-10-CM | POA: Diagnosis not present

## 2021-09-15 DIAGNOSIS — H900 Conductive hearing loss, bilateral: Secondary | ICD-10-CM

## 2021-09-15 DIAGNOSIS — Z1211 Encounter for screening for malignant neoplasm of colon: Secondary | ICD-10-CM

## 2021-09-15 DIAGNOSIS — R42 Dizziness and giddiness: Secondary | ICD-10-CM

## 2021-09-15 DIAGNOSIS — G8929 Other chronic pain: Secondary | ICD-10-CM | POA: Insufficient documentation

## 2021-09-15 DIAGNOSIS — E782 Mixed hyperlipidemia: Secondary | ICD-10-CM

## 2021-09-15 DIAGNOSIS — R202 Paresthesia of skin: Secondary | ICD-10-CM | POA: Insufficient documentation

## 2021-09-15 NOTE — Patient Instructions (Signed)
This visit was a preventative care visit, also known as wellness visit or routine physical.   Topics typically include healthy lifestyle, diet, exercise, preventative care, vaccinations, sick and well care, proper use of emergency dept and after hours care, as well as other concerns.     Recommendations: Continue to return yearly for your annual wellness and preventative care visits.  This gives [place] a chance to discuss healthy lifestyle, exercise, vaccinations, review your chart record, and perform screenings where appropriate.  I recommend you see your eye doctor yearly for routine vision care.  I recommend you see your dentist yearly for routine dental care including hygiene visits twice yearly.   Vaccination recommendations were reviewed Immunization History  Administered Date(s) Administered   Influenza,inj,Quad PF,6+ Mos 03/07/2019   PFIZER(Purple Top)SARS-COV-2 Vaccination 07/05/2020, 07/26/2020   Tdap 03/07/2019    I recommend a yearly flu shot  Shingles vaccine:  I recommend you have a shingles vaccine to help prevent shingles or herpes zoster outbreak.   Please call your insurer to inquire about coverage for the Shingrix vaccine given in 2 doses.   Some insurers cover this vaccine after age 84, some cover this after age 85.  If your insurer covers this, then call to schedule appointment to have this vaccine here.  I recommend pneumcoccal vaccine as well, every 5 years.   Screening for cancer: Colon cancer screening: We will refer you for Cologard   We discussed PSA, prostate exam, and prostate cancer screening risks/benefits.     Skin cancer screening: Check your skin regularly for new changes, growing lesions, or other lesions of concern Come in for evaluation if you have skin lesions of concern.  Lung cancer screening: If you have a greater than 20 pack year history of tobacco use, then you may qualify for lung cancer screening with a chest CT scan.   Please call your  insurance company to inquire about coverage for this test.  We currently don't have screenings for other cancers besides breast, cervical, colon, and lung cancers.  If you have a strong family history of cancer or have other cancer screening concerns, please let me know.    Bone health: Get at least 150 minutes of aerobic exercise weekly Get weight bearing exercise at least once weekly Bone density test:  A bone density test is an imaging test that uses a type of X-ray to measure the amount of calcium and other minerals in your bones. The test may be used to diagnose or screen you for a condition that causes weak or thin bones (osteoporosis), predict your risk for a broken bone (fracture), or determine how well your osteoporosis treatment is working. The bone density test is recommended for females 65 and older, or females or males <65 if certain risk factors such as thyroid disease, long term use of steroids such as for asthma or rheumatological issues, vitamin D deficiency, estrogen deficiency, family history of osteoporosis, self or family history of fragility fracture in first degree relative.    Heart health: Get at least 150 minutes of aerobic exercise weekly Limit alcohol It is important to maintain a healthy blood pressure and healthy cholesterol numbers  Heart disease screening: Screening for heart disease includes screening for blood pressure, fasting lipids, glucose/diabetes screening, BMI height to weight ratio, reviewed of smoking status, physical activity, and diet.    Goals include blood pressure 120/80 or less, maintaining a healthy lipid/cholesterol profile, preventing diabetes or keeping diabetes numbers under good control, not smoking or  using tobacco products, exercising most days per week or at least 150 minutes per week of exercise, and eating healthy variety of fruits and vegetables, healthy oils, and avoiding unhealthy food choices like fried food, fast food, high sugar  and high cholesterol foods.    Other tests may possibly include EKG test, CT coronary calcium score, echocardiogram, exercise treadmill stress test.     Medical care options: I recommend you continue to seek care here first for routine care.  We try really hard to have available appointments Monday through Friday daytime hours for sick visits, acute visits, and physicals.  Urgent care should be used for after hours and weekends for significant issues that cannot wait till the next day.  The emergency department should be used for significant potentially life-threatening emergencies.  The emergency department is expensive, can often have long wait times for less significant concerns, so try to utilize primary care, urgent care, or telemedicine when possible to avoid unnecessary trips to the emergency department.  Virtual visits and telemedicine have been introduced since the pandemic started in 2020, and can be convenient ways to receive medical care.  We offer virtual appointments as well to assist you in a variety of options to seek medical care.   Separate significant issues discussed: Hypertension-continue current medication  Tobacco use-I strongly recommend you quit smoking as this puts you at risk for heart disease, cancer, lung disease, stroke and is the cause of your elevated red cells  High cholesterol/dyslipidemia-continue your cholesterol medication daily  Dizziness, blurred vision, double vision-I am going to get you set up for MRI brain and neck.  You may end up needing to see neurology but less rule out some things first with the imaging.  You also need to see an eye doctor as well  Burning sensation of lower extremities-continue gabapentin but we may consider referral to neurology

## 2021-09-15 NOTE — Progress Notes (Signed)
Subjective:   HPI  David Shepherd is a 55 y.o. male who presents for Chief Complaint  Patient presents with   fasting cpe    Fasting cpe, headaches going on for a while and pain in shoulders since 2019. Declines flu shot today. Would like to do cologuard for his colon screening   Here with interpreter Daiu Siu.  Patient Care Team: Lucrezia Dehne, Cleda Mccreedy as PCP - General (Family Medicine) Malachy Mood, MD as Consulting Physician (Hematology) Sees dentist Sees eye doctor  Concerns: Hypertension - compliant with medicaiton without c/o.  Home BPs usually 120/80s.    He wants to wait and get his flu shot in October got some apple in the room  He would like to do Cologuard instead of colonoscopy for colon cancer screening  Last year I sent him to hematology for abnormal red cells.  He still smokes although he was advised to quit smoking which is the cause of his elevated RBCs  His main concern is frequent headaches and dizziness.  Dizziness comes and goes.  Getting headaches about 2 times a day.  He feels blurred vision and even double vision.  For example if he has stopped thyroid tract he cannot wants to try and get back as it makes him too dizzy.  He continues to get burning sensations in his feet.  No new numbness or tingling.  He is compliant with gabapentin  Hyperlipidemia-compliant with Lipitor without complaint  reviewed their medical, surgical, family, social, medication, and allergy history and updated chart as appropriate.  Past Medical History:  Diagnosis Date   Hypertension     Past Surgical History:  Procedure Laterality Date   NO PAST SURGERIES  11/2018    Family History  Family history unknown: Yes     Current Outpatient Medications:    amLODipine (NORVASC) 5 MG tablet, Take 1 tablet (5 mg total) by mouth daily., Disp: 30 tablet, Rfl: 0   atorvastatin (LIPITOR) 20 MG tablet, TAKE 1 TABLET(20 MG) BY MOUTH DAILY, Disp: 30 tablet, Rfl: 0   gabapentin  (NEURONTIN) 100 MG capsule, TAKE 2 CAPSULES(200 MG) BY MOUTH AT BEDTIME, Disp: 180 capsule, Rfl: 0  Allergies  Allergen Reactions   Amlodipine Palpitations   Hctz [Hydrochlorothiazide] Other (See Comments)    Headache      Review of Systems Constitutional: -fever, -chills, -sweats, -unexpected weight change, -decreased appetite, -fatigue Allergy: -sneezing, -itching, -congestion Dermatology: -changing moles, --rash, -lumps ENT: -runny nose, -ear pain, -sore throat, -hoarseness, -sinus pain, -teeth pain, - ringing in ears, -hearing loss, -nosebleeds Cardiology: -chest pain, -palpitations, -swelling, -difficulty breathing when lying flat, -waking up short of breath Respiratory: -cough, -shortness of breath, -difficulty breathing with exercise or exertion, -wheezing, -coughing up blood Gastroenterology: -abdominal pain, -nausea, -vomiting, -diarrhea, -constipation, -blood in stool, -changes in bowel movement, -difficulty swallowing or eating Hematology: -bleeding, -bruising  Musculoskeletal: -joint aches, -muscle aches, -joint swelling, -back pain, -neck pain, -cramping, -changes in gait Ophthalmology: denies vision changes, eye redness, itching, discharge Urology: -burning with urination, -difficulty urinating, -blood in urine, -urinary frequency, -urgency, -incontinence Neurology: +headache, -weakness, +tingling, -numbness, -memory loss, -falls, +dizziness Psychology: -depressed mood, -agitation, -sleep problems Male GU: no testicular mass, pain, no lymph nodes swollen, no swelling, no rash.  Depression screen [facility] 2/9 09/15/2021 04/16/2021 03/07/2019 12/22/2018  Decreased Interest 0 0 0 0  Down, Depressed, Hopeless 0 0 1 0  PHQ - 2 Score 0 0 1 0        Objective:  BP 124/82  Pulse 69   Ht 5' (1.524 m)   Wt 130 lb 6.4 oz (59.1 kg)   BMI 25.47 kg/m   General appearance: alert, no distress, WD/WN, Asian male Skin: unremarkable HEENT: normocephalic, conjunctiva/corneas normal,  sclerae anicteric, PERRLA, EOMi, nares patent, no discharge or erythema, pharynx normal Oral cavity: MMM, tongue normal, teeth in good repair Neck: supple, no lymphadenopathy, no thyromegaly, no masses, normal ROM, no bruits Chest: non tender, normal shape and expansion Heart: RRR, normal S1, S2, no murmurs Lungs: CTA bilaterally, no wheezes, rhonchi, or rales Abdomen: +bs, soft, non tender, non distended, no masses, no hepatomegaly, no splenomegaly, no bruits Back: non tender, normal ROM, no scoliosis Musculoskeletal: upper extremities non tender, no obvious deformity, normal ROM throughout, lower extremities non tender, no obvious deformity, normal ROM throughout Extremities: no edema, no cyanosis, no clubbing Pulses: 2+ symmetric, upper and lower extremities, normal cap refill Neurological: alert, oriented x 3, CN2-12 intact, strength normal upper extremities and lower extremities, sensation normal throughout, DTRs 2+ throughout, no cerebellar signs, gait normal Psychiatric: normal affect, behavior normal, pleasant  GU/rectal - deferred   Assessment and Plan :   Encounter Diagnoses  Name Primary?   Encounter for health maintenance examination in adult Yes   Essential hypertension, benign    Tobacco use    Screening for prostate cancer    Presbycusis of both ears    Mixed dyslipidemia    Conductive hearing loss, bilateral    Screen for colon cancer    Chronic nonintractable headache, unspecified headache type    Dizziness    Blurred vision    Double vision    Paresthesia of both lower extremities     This visit was a preventative care visit, also known as wellness visit or routine physical.   Topics typically include healthy lifestyle, diet, exercise, preventative care, vaccinations, sick and well care, proper use of emergency dept and after hours care, as well as other concerns.     Recommendations: Continue to return yearly for your annual wellness and preventative care  visits.  This gives [place] a chance to discuss healthy lifestyle, exercise, vaccinations, review your chart record, and perform screenings where appropriate.  I recommend you see your eye doctor yearly for routine vision care.  I recommend you see your dentist yearly for routine dental care including hygiene visits twice yearly.   Vaccination recommendations were reviewed Immunization History  Administered Date(s) Administered   Influenza,inj,Quad PF,6+ Mos 03/07/2019   PFIZER(Purple Top)SARS-COV-2 Vaccination 07/05/2020, 07/26/2020   Tdap 03/07/2019    I recommend a yearly flu shot  Shingles vaccine:  I recommend you have a shingles vaccine to help prevent shingles or herpes zoster outbreak.   Please call your insurer to inquire about coverage for the Shingrix vaccine given in 2 doses.   Some insurers cover this vaccine after age 38, some cover this after age 23.  If your insurer covers this, then call to schedule appointment to have this vaccine here.  I recommend pneumcoccal vaccine as well, every 5 years.   Screening for cancer: Colon cancer screening: We will refer you for Cologard   We discussed PSA, prostate exam, and prostate cancer screening risks/benefits.     Skin cancer screening: Check your skin regularly for new changes, growing lesions, or other lesions of concern Come in for evaluation if you have skin lesions of concern.  Lung cancer screening: If you have a greater than 20 pack year history of tobacco use, then you may qualify  for lung cancer screening with a chest CT scan.   Please call your insurance company to inquire about coverage for this test.  We currently don't have screenings for other cancers besides breast, cervical, colon, and lung cancers.  If you have a strong family history of cancer or have other cancer screening concerns, please let me know.    Bone health: Get at least 150 minutes of aerobic exercise weekly Get weight bearing exercise at least  once weekly Bone density test:  A bone density test is an imaging test that uses a type of X-ray to measure the amount of calcium and other minerals in your bones. The test may be used to diagnose or screen you for a condition that causes weak or thin bones (osteoporosis), predict your risk for a broken bone (fracture), or determine how well your osteoporosis treatment is working. The bone density test is recommended for females 65 and older, or females or males <65 if certain risk factors such as thyroid disease, long term use of steroids such as for asthma or rheumatological issues, vitamin D deficiency, estrogen deficiency, family history of osteoporosis, self or family history of fragility fracture in first degree relative.    Heart health: Get at least 150 minutes of aerobic exercise weekly Limit alcohol It is important to maintain a healthy blood pressure and healthy cholesterol numbers  Heart disease screening: Screening for heart disease includes screening for blood pressure, fasting lipids, glucose/diabetes screening, BMI height to weight ratio, reviewed of smoking status, physical activity, and diet.    Goals include blood pressure 120/80 or less, maintaining a healthy lipid/cholesterol profile, preventing diabetes or keeping diabetes numbers under good control, not smoking or using tobacco products, exercising most days per week or at least 150 minutes per week of exercise, and eating healthy variety of fruits and vegetables, healthy oils, and avoiding unhealthy food choices like fried food, fast food, high sugar and high cholesterol foods.    Other tests may possibly include EKG test, CT coronary calcium score, echocardiogram, exercise treadmill stress test.     Medical care options: I recommend you continue to seek care here first for routine care.  We try really hard to have available appointments Monday through Friday daytime hours for sick visits, acute visits, and physicals.   Urgent care should be used for after hours and weekends for significant issues that cannot wait till the next day.  The emergency department should be used for significant potentially life-threatening emergencies.  The emergency department is expensive, can often have long wait times for less significant concerns, so try to utilize primary care, urgent care, or telemedicine when possible to avoid unnecessary trips to the emergency department.  Virtual visits and telemedicine have been introduced since the pandemic started in 2020, and can be convenient ways to receive medical care.  We offer virtual appointments as well to assist you in a variety of options to seek medical care.   Separate significant issues discussed: Hypertension-continue current medication  Tobacco use-I strongly recommend you quit smoking as this puts you at risk for heart disease, cancer, lung disease, stroke and is the cause of your elevated red cells  High cholesterol/dyslipidemia-continue your cholesterol medication daily  Dizziness, blurred vision, double vision-I am going to get you set up for MRI brain and neck.  You may end up needing to see neurology but less rule out some things first with the imaging.  You also need to see an eye doctor as well  Burning sensation of lower extremities-continue gabapentin but we may consider referral to neurology   [facility] was seen today for fasting cpe.  Diagnoses and all orders for this visit:  Encounter for health maintenance examination in adult -     Cologuard -     Lipid panel -     Comprehensive metabolic panel -     Hemoglobin A1c -     PSA -     Vitamin B12 -     CBC  Essential hypertension, benign -     Comprehensive metabolic panel  Tobacco use  Screening for prostate cancer -     PSA  Presbycusis of both ears  Mixed dyslipidemia -     Lipid panel  Conductive hearing loss, bilateral  Screen for colon cancer  Chronic nonintractable headache,  unspecified headache type -     MR Angiogram Neck W Wo Contrast; Future -     MR Brain W Wo Contrast; Future  Dizziness -     MR Angiogram Neck W Wo Contrast; Future -     MR Brain W Wo Contrast; Future  Blurred vision -     MR Angiogram Neck W Wo Contrast; Future -     MR Brain W Wo Contrast; Future  Double vision -     MR Angiogram Neck W Wo Contrast; Future -     MR Brain W Wo Contrast; Future  Paresthesia of both lower extremities -     MR Angiogram Neck W Wo Contrast; Future -     MR Brain W Wo Contrast; Future   Follow-up pending labs, yearly for physical

## 2021-09-16 LAB — COMPREHENSIVE METABOLIC PANEL
ALT: 22 IU/L (ref 0–44)
AST: 38 IU/L (ref 0–40)
Albumin/Globulin Ratio: 1.5 (ref 1.2–2.2)
Albumin: 4.4 g/dL (ref 3.8–4.9)
Alkaline Phosphatase: 59 IU/L (ref 44–121)
BUN/Creatinine Ratio: 12 (ref 9–20)
BUN: 10 mg/dL (ref 6–24)
Bilirubin Total: 1.1 mg/dL (ref 0.0–1.2)
CO2: 23 mmol/L (ref 20–29)
Calcium: 9.1 mg/dL (ref 8.7–10.2)
Chloride: 104 mmol/L (ref 96–106)
Creatinine, Ser: 0.85 mg/dL (ref 0.76–1.27)
Globulin, Total: 2.9 g/dL (ref 1.5–4.5)
Glucose: 94 mg/dL (ref 65–99)
Potassium: 3.8 mmol/L (ref 3.5–5.2)
Sodium: 140 mmol/L (ref 134–144)
Total Protein: 7.3 g/dL (ref 6.0–8.5)
eGFR: 103 mL/min/{1.73_m2} (ref >59)

## 2021-09-16 LAB — URINALYSIS
Bilirubin, UA: NEGATIVE
Glucose, UA: NEGATIVE
Leukocytes,UA: NEGATIVE
Nitrite, UA: NEGATIVE
Protein,UA: NEGATIVE
RBC, UA: NEGATIVE
Specific Gravity, UA: 1.019 (ref 1.005–1.030)
Urobilinogen, Ur: 0.2 mg/dL (ref 0.2–1.0)
pH, UA: 6 (ref 5.0–7.5)

## 2021-09-16 LAB — VITAMIN B12: Vitamin B-12: 1505 pg/mL — ABNORMAL HIGH (ref 232–1245)

## 2021-09-16 LAB — CBC
Hematocrit: 48.1 % (ref 37.5–51.0)
Hemoglobin: 15.2 g/dL (ref 13.0–17.7)
MCH: 22.7 pg — ABNORMAL LOW (ref 26.6–33.0)
MCHC: 31.6 g/dL (ref 31.5–35.7)
MCV: 72 fL — ABNORMAL LOW (ref 79–97)
Platelets: 106 10*3/uL — ABNORMAL LOW (ref 150–450)
RBC: 6.69 x10E6/uL — ABNORMAL HIGH (ref 4.14–5.80)
RDW: 14.4 % (ref 11.6–15.4)
WBC: 6.3 10*3/uL (ref 3.4–10.8)

## 2021-09-16 LAB — LIPID PANEL
Chol/HDL Ratio: 3.5 ratio (ref 0.0–5.0)
Cholesterol, Total: 99 mg/dL — ABNORMAL LOW (ref 100–199)
HDL: 28 mg/dL — ABNORMAL LOW (ref >39)
LDL Chol Calc (NIH): 52 mg/dL (ref 0–99)
Triglycerides: 100 mg/dL (ref 0–149)
VLDL Cholesterol Cal: 19 mg/dL (ref 5–40)

## 2021-09-16 LAB — HEMOGLOBIN A1C
Est. average glucose Bld gHb Est-mCnc: 123 mg/dL
Hgb A1c MFr Bld: 5.9 % — ABNORMAL HIGH (ref 4.8–5.6)

## 2021-09-16 LAB — PSA: Prostate Specific Ag, Serum: 1.3 ng/mL (ref 0.0–4.0)

## 2021-09-17 ENCOUNTER — Telehealth: Payer: Self-pay | Admitting: Medical

## 2021-09-17 ENCOUNTER — Other Ambulatory Visit: Payer: Self-pay | Admitting: Medical

## 2021-09-17 MED ORDER — AMLODIPINE BESYLATE 5 MG PO TABS: 5.0000 mg | ORAL_TABLET | Freq: Every day | ORAL | 3 refills | Status: DC

## 2021-09-17 MED ORDER — ATORVASTATIN CALCIUM 20 MG PO TABS: ORAL_TABLET | ORAL | 3 refills | Status: DC

## 2021-09-17 MED ORDER — GABAPENTIN 300 MG PO CAPS: 300.0000 mg | ORAL_CAPSULE | Freq: Two times a day (BID) | ORAL | 2 refills | Status: DC

## 2021-09-17 NOTE — Telephone Encounter (Signed)
Pt was notified of results

## 2021-09-17 NOTE — Telephone Encounter (Signed)
See lab results message as well.  I changed his gabapentin to 300 mg twice a day to help with pains

## 2021-09-23 LAB — COLOGUARD: Cologuard: NEGATIVE

## 2021-09-24 ENCOUNTER — Encounter: Payer: Self-pay | Admitting: Internal Medicine

## 2021-10-08 ENCOUNTER — Ambulatory Visit
Admission: RE | Admit: 2021-10-08 | Discharge: 2021-10-08 | Disposition: A | Discharge status: Home / Self Care | Payer: 59 | Source: Ambulatory Visit | Attending: Medical | Admitting: Medical

## 2021-10-08 DIAGNOSIS — R42 Dizziness and giddiness: Secondary | ICD-10-CM

## 2021-10-08 DIAGNOSIS — H532 Diplopia: Secondary | ICD-10-CM

## 2021-10-08 DIAGNOSIS — H538 Other visual disturbances: Secondary | ICD-10-CM

## 2021-10-08 DIAGNOSIS — G8929 Other chronic pain: Secondary | ICD-10-CM

## 2021-10-08 DIAGNOSIS — R202 Paresthesia of skin: Secondary | ICD-10-CM

## 2021-10-08 DIAGNOSIS — R519 Headache, unspecified: Secondary | ICD-10-CM

## 2021-10-08 MED ORDER — GADOBENATE DIMEGLUMINE 529 MG/ML IV SOLN
12.0000 mL | Freq: Once | INTRAVENOUS | Status: AC | PRN
  Administered 2021-10-08: 12 mL via INTRAVENOUS

## 2021-10-11 ENCOUNTER — Other Ambulatory Visit: Payer: Self-pay | Admitting: Medical

## 2021-10-11 DIAGNOSIS — H538 Other visual disturbances: Secondary | ICD-10-CM

## 2021-10-11 DIAGNOSIS — R42 Dizziness and giddiness: Secondary | ICD-10-CM

## 2021-10-11 DIAGNOSIS — R200 Anesthesia of skin: Secondary | ICD-10-CM

## 2021-10-12 ENCOUNTER — Other Ambulatory Visit: Payer: Self-pay | Admitting: Medical

## 2021-10-12 DIAGNOSIS — E041 Nontoxic single thyroid nodule: Secondary | ICD-10-CM

## 2021-10-17 ENCOUNTER — Other Ambulatory Visit: Payer: Self-pay | Admitting: Medical

## 2021-10-26 ENCOUNTER — Ambulatory Visit
Admission: RE | Admit: 2021-10-26 | Discharge: 2021-10-26 | Disposition: A | Discharge status: Home / Self Care | Payer: 59 | Source: Ambulatory Visit | Attending: Medical | Admitting: Medical

## 2021-10-26 DIAGNOSIS — E041 Nontoxic single thyroid nodule: Secondary | ICD-10-CM

## 2021-11-13 ENCOUNTER — Other Ambulatory Visit: Payer: Self-pay | Admitting: Medical

## 2021-11-26 ENCOUNTER — Ambulatory Visit (INDEPENDENT_AMBULATORY_CARE_PROVIDER_SITE_OTHER): Payer: 59 | Admitting: Neurology

## 2021-11-26 ENCOUNTER — Other Ambulatory Visit: Payer: Self-pay

## 2021-11-26 ENCOUNTER — Encounter: Payer: Self-pay | Admitting: Neurology

## 2021-11-26 VITALS — BP 121/81 | HR 85 | Ht 60.0 in | Wt 132.5 lb

## 2021-11-26 DIAGNOSIS — R42 Dizziness and giddiness: Secondary | ICD-10-CM

## 2021-11-26 DIAGNOSIS — R200 Anesthesia of skin: Secondary | ICD-10-CM | POA: Diagnosis not present

## 2021-11-26 DIAGNOSIS — G44209 Tension-type headache, unspecified, not intractable: Secondary | ICD-10-CM | POA: Diagnosis not present

## 2021-11-26 MED ORDER — MECLIZINE HCL 12.5 MG PO TABS: 12.5000 mg | ORAL_TABLET | Freq: Three times a day (TID) | ORAL | 0 refills | Status: DC | PRN

## 2021-11-26 MED ORDER — GABAPENTIN 100 MG PO CAPS: 200.0000 mg | ORAL_CAPSULE | Freq: Every day | ORAL | 3 refills | Status: DC

## 2021-11-26 NOTE — Patient Instructions (Signed)
Continue with Gabapentin 200 mg nightly  Trial of Meclizine for dizziness  Can take Ibuprofen/Tylenol for headaches  Follow up with your doctor and return if worse

## 2021-11-26 NOTE — Progress Notes (Signed)
[facility]  PATIENT: David Shepherd DOB: [birth year]  REQUESTING CLINICIAN: Tysinger, Kermit Balo, PA-C HISTORY FROM: Patient via interpretor  REASON FOR VISIT: Headaches, dizziness    HISTORICAL  CHIEF COMPLAINT:  Chief Complaint  Patient presents with   New Patient (Initial Visit)    Rm 13. Accompanied by interpreter. NP/internal referral for blurred vision, dizziness, numbness and tingling of left arm. He is complaining of numbness in the left arm that occurs nightly. Patient states his gabapentin dosage is too high and causes him to have headaches.    HISTORY OF PRESENT ILLNESS:  This is a 55 year old gentleman with past medical history of hypertension, hyperlipidemia and bilateral hand and feet numbness who is presenting with complaint of headaches.  Patient described the headaches are pounding type headache that started since 2015.  With the headaches he also have dizziness, sometimes he does have dizziness without the headache and reported currently dizziness has improved.  Currently he is getting about 2-3 headaches per week that last about an hour or so and now sometimes will take Tylenol or ibuprofen with improvement.  Dizziness improved about once per week.  He does not take any medication for dizziness.  For his numbness in the hands and feet, patient notices mostly at night, stated that the gabapentin helps.  He reported while he was taking gabapentin 200 mg he was doing well but since the increase it to 300 mg it seemed like his headaches are worse\, no migrainous features with the headaches.     OTHER MEDICAL CONDITIONS: HTN, HLD and numbness    REVIEW OF SYSTEMS: Full 14 system review of systems performed and negative with exception of: as noted in the HPI   ALLERGIES: Allergies  Allergen Reactions   Amlodipine Palpitations   Hctz [Hydrochlorothiazide] Other (See Comments)    Headache     HOME MEDICATIONS: Outpatient Medications Prior to Visit   Medication Sig Dispense Refill   amLODipine (NORVASC) 5 MG tablet Take 1 tablet (5 mg total) by mouth daily. 90 tablet 3   atorvastatin (LIPITOR) 20 MG tablet TAKE 1 TABLET(20 MG) BY MOUTH DAILY 30 tablet 2   gabapentin (NEURONTIN) 300 MG capsule Take 1 capsule (300 mg total) by mouth 2 (two) times daily. 60 capsule 2   No facility-administered medications prior to visit.    PAST MEDICAL HISTORY: Past Medical History:  Diagnosis Date   Hypertension     PAST SURGICAL HISTORY: Past Surgical History:  Procedure Laterality Date   NO PAST SURGERIES  11/2018    FAMILY HISTORY: Family History  Family history unknown: Yes    SOCIAL HISTORY: Social History   Socioeconomic History   Marital status: Married    Spouse name: Not on file   Number of children: Not on file   Years of education: Not on file   Highest education level: Not on file  Occupational History   Not on file  Tobacco Use   Smoking status: Every Day    Packs/day: 1.00    Years: 35.00    Pack years: 35.00    Types: Cigarettes   Smokeless tobacco: Never   Tobacco comments:    1-2 packs smoked daily 11/13/20 [facility]   Vaping Use   Vaping Use: Never used  Substance and Sexual Activity   Alcohol use: Not Currently    Comment: occasional   Drug use: Not Currently   Sexual activity: Not on file  Other Topics Concern   Not on file  Social  History Narrative   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   Social Determinants of Health   Financial Resource Strain: Not on file  Food Insecurity: Not on file  Transportation Needs: Not on file  Physical Activity: Not on file  Stress: Not on file  Social Connections: Not on file  Intimate Partner Violence: Not on file    PHYSICAL EXAM  GENERAL EXAM/CONSTITUTIONAL: Vitals:  Vitals:   11/26/21 1307  BP: 121/81  Pulse: 85  Weight: 132 lb 8 oz (60.1 kg)  Height: 5' (1.524 m)   Body mass index is 25.88 kg/m. Wt Readings from Last 3  Encounters:  11/26/21 132 lb 8 oz (60.1 kg)  09/15/21 130 lb 6.4 oz (59.1 kg)  11/13/20 132 lb 3.2 oz (60 kg)   Patient is in no distress; well developed, nourished and groomed; neck is supple  CARDIOVASCULAR: Examination of carotid arteries is normal; no carotid bruits Regular rate and rhythm, no murmurs Examination of peripheral vascular system by observation and palpation is normal  EYES: Pupils round and reactive to light, Visual fields full to confrontation, Extraocular movements intacts,   MUSCULOSKELETAL: Gait, strength, tone, movements noted in Neurologic exam below  NEUROLOGIC: MENTAL STATUS:  No flowsheet data found. awake, alert, oriented to person, place and time recent and remote memory intact normal attention and concentration language fluent, comprehension intact, naming intact fund of knowledge appropriate  CRANIAL NERVE:  2nd, 3rd, 4th, 6th - pupils equal and reactive to light, visual fields full to confrontation, extraocular muscles intact, no nystagmus 5th - facial sensation symmetric 7th - facial strength symmetric 8th - hearing intact 9th - palate elevates symmetrically, uvula midline 11th - shoulder shrug symmetric 12th - tongue protrusion midline  MOTOR:  normal bulk and tone, full strength in the BUE, BLE  SENSORY:  normal and symmetric to light touch, pinprick, temperature, vibration  COORDINATION:  finger-nose-finger, fine finger movements normal  REFLEXES:  deep tendon reflexes present and symmetric  GAIT/STATION:  normal     DIAGNOSTIC DATA (LABS, IMAGING, TESTING) - I reviewed patient records, labs, notes, testing and imaging myself where available.  Lab Results  Component Value Date   WBC 6.3 09/15/2021   HGB 15.2 09/15/2021   HCT 48.1 09/15/2021   MCV 72 (L) 09/15/2021   PLT 106 (L) 09/15/2021      Component Value Date/Time   NA 140 09/15/2021 1030   K 3.8 09/15/2021 1030   CL 104 09/15/2021 1030   CO2 23 09/15/2021  1030   GLUCOSE 94 09/15/2021 1030   GLUCOSE 129 (H) 04/04/2015 1002   BUN 10 09/15/2021 1030   CREATININE 0.85 09/15/2021 1030   CALCIUM 9.1 09/15/2021 1030   PROT 7.3 09/15/2021 1030   ALBUMIN 4.4 09/15/2021 1030   AST 38 09/15/2021 1030   ALT 22 09/15/2021 1030   ALKPHOS 59 09/15/2021 1030   BILITOT 1.1 09/15/2021 1030   GFRNONAA 92 06/12/2020 1042   GFRAA 106 06/12/2020 1042   Lab Results  Component Value Date   CHOL 99 (L) 09/15/2021   HDL 28 (L) 09/15/2021   LDLCALC 52 09/15/2021   TRIG 100 09/15/2021   CHOLHDL 3.5 09/15/2021   Lab Results  Component Value Date   HGBA1C 5.9 (H) 09/15/2021   Lab Results  Component Value Date   VITAMINB12 1,505 (H) 09/15/2021   Lab Results  Component Value Date   TSH 1.820 06/12/2020   MRI Brain 10/08/2021 No evidence of acute intracranial  abnormality. Chronic small-vessel ischemic changes in the cerebral white matter, overall mild but advanced for age. Chronic microhemorrhage within the right midbrain. Bilateral middle ear/mastoid effusions.    ASSESSMENT AND PLAN  55 y.o. year old male with hypertension, hyperlipidemia and bilateral hand and feet numbness who is presenting with complaint of headaches and intermittent dizziness.  His neurological examination is nonfocal.  At this time, headache likely tension type headaches. He is getting 2-3 headache episodes per week, lasting a couple hours maximum.  I advised the patient to continue with gabapentin as preventive medication for headache and to use ibuprofen and Tylenol as needed for headache.  For his intermittent dizziness I will try him on low-dose meclizine.  Follow-up with your primary care doctor and return if worse. Continue Gabapentin for the numbness.    1. Tension headache   2. Dizziness   3. Numbness     PLAN: Continue with Gabapentin 200 mg nightly  Trial of Meclizine for dizziness  Can take Ibuprofen/Tylenol for headaches  Follow up with your doctor and  return if worse   No orders of the defined types were placed in this encounter.   Meds ordered this encounter  Medications   gabapentin (NEURONTIN) 100 MG capsule    Sig: Take 2 capsules (200 mg total) by mouth at bedtime.    Dispense:  60 capsule    Refill:  3   meclizine (ANTIVERT) 12.5 MG tablet    Sig: Take 1 tablet (12.5 mg total) by mouth 3 (three) times daily as needed for dizziness.    Dispense:  30 tablet    Refill:  0    Return if symptoms worsen or fail to improve.    Windell Norfolk, MD 11/26/2021, 4:40 PM  [facility] [address], [address] [place], [place] [address] [phone]

## 2021-12-24 ENCOUNTER — Other Ambulatory Visit: Payer: Self-pay

## 2021-12-24 ENCOUNTER — Other Ambulatory Visit: Payer: Self-pay | Admitting: Medical

## 2021-12-24 ENCOUNTER — Ambulatory Visit: Payer: 59 | Admitting: Medical

## 2021-12-24 VITALS — BP 120/64 | HR 82 | Wt 133.2 lb

## 2021-12-24 DIAGNOSIS — H9313 Tinnitus, bilateral: Secondary | ICD-10-CM

## 2021-12-24 DIAGNOSIS — R202 Paresthesia of skin: Secondary | ICD-10-CM

## 2021-12-24 DIAGNOSIS — I10 Essential (primary) hypertension: Secondary | ICD-10-CM

## 2021-12-24 DIAGNOSIS — H65493 Other chronic nonsuppurative otitis media, bilateral: Secondary | ICD-10-CM

## 2021-12-24 DIAGNOSIS — R718 Other abnormality of red blood cells: Secondary | ICD-10-CM

## 2021-12-24 DIAGNOSIS — H9113 Presbycusis, bilateral: Secondary | ICD-10-CM

## 2021-12-24 DIAGNOSIS — E041 Nontoxic single thyroid nodule: Secondary | ICD-10-CM

## 2021-12-24 DIAGNOSIS — Z720 Tobacco use: Secondary | ICD-10-CM

## 2021-12-24 DIAGNOSIS — E782 Mixed hyperlipidemia: Secondary | ICD-10-CM

## 2021-12-24 DIAGNOSIS — R200 Anesthesia of skin: Secondary | ICD-10-CM

## 2021-12-24 DIAGNOSIS — H900 Conductive hearing loss, bilateral: Secondary | ICD-10-CM

## 2021-12-24 MED ORDER — MECLIZINE HCL 25 MG PO TABS: 25.0000 mg | ORAL_TABLET | Freq: Every day | ORAL | 1 refills | Status: DC

## 2021-12-24 MED ORDER — ATORVASTATIN CALCIUM 20 MG PO TABS
20.0000 mg | ORAL_TABLET | Freq: Every day | ORAL | 3 refills | Status: DC
Start: 2021-12-24 — End: 2022-09-17

## 2021-12-24 NOTE — Progress Notes (Signed)
Subjective:  David Shepherd is a 55 y.o. male who presents for Chief Complaint  Patient presents with   follow-up    Discuss labs results from September     Patient Care Team: Tiyana Galla, Kermit Balo, PA-C as PCP - General (Family Medicine) Malachy Mood, MD as Consulting Physician (Hematology) Dr. Windell Norfolk, neurology  Sees dentist Sees eye doctor   Here with interpreter through [facility]  Since last visit had Cologard test that was negative.  Had thyroid ultrasound showing stable nodules .   Saw neurology regarding dizziness, numbness, f/u on recent MRI/MRA.    Was put on meclizine for a few weeks.  No longer having any dizziness  He does note chronic ear issues, chronic hearing loss and ringing in ears  He continues to smoke.  Didn't get improvement on nicotine patches prior.  No other aggravating or relieving factors.    No other c/o.  Past Medical History:  Diagnosis Date   Hypertension    Current Outpatient Medications on File Prior to Visit  Medication Sig Dispense Refill   amLODipine (NORVASC) 5 MG tablet Take 1 tablet (5 mg total) by mouth daily. 90 tablet 3   gabapentin (NEURONTIN) 100 MG capsule Take 2 capsules (200 mg total) by mouth at bedtime. 60 capsule 3   No current facility-administered medications on file prior to visit.     The following portions of the patient's history were reviewed and updated as appropriate: allergies, current medications, past family history, past medical history, past social history, past surgical history and problem list.  ROS Otherwise as in subjective above  Objective: BP 120/64    Pulse 82    Wt 133 lb 3.2 oz (60.4 kg)    BMI 26.01 kg/m   General appearance: alert, no distress, well developed, well nourished HEENT: normocephalic, sclerae anicteric, conjunctiva pink and moist, TMs with retraction but no erythema, no effusion,  nares patent, no discharge or erythema, pharynx normal Oral cavity: MMM, no lesions Neck: supple,  no lymphadenopathy, no thyromegaly, no masses Heart: RRR, normal S1, S2, no murmurs Lungs: CTA bilaterally, no wheezes, rhonchi, or rales Pulses: 2+ radial pulses, 2+ pedal pulses, normal cap refill Ext: no edema   Assessment: Encounter Diagnoses  Name Primary?   Thyroid nodule Yes   Tobacco use    Tinnitus of both ears    Presbycusis of both ears    Mixed dyslipidemia    Essential hypertension, benign    Conductive hearing loss, bilateral    Elevated red blood cell count    Chronic otitis media of both ears with effusion    Paresthesia of both lower extremities    Numbness and tingling in left arm      Plan: Thyroid nodules - reviewed ultrasound from 09/2021.  Per radiology recommendations, plan annual repeat thyroid ultrasound 09/2022  Tobacco use - he prior did not do well with nicotine patches.  Declines patches.   Declines Wellbutrin.  He work on efforts to lose weight  Tinnitus, hearing loss, abnormal findings of ear effusion on recent MRI.  Begin back on Meclizine daily , hydrate well, and referral to ENT  Mixed dyslipidemia - c/t statin  Hypertension - continue current medication  Elevated red cells - eval through hematology 2021.   After eval, advised he stop tobacco.  Again reiterated need to stop tobacco!  Numbness, paresthesias - reviewed recent neurology consult, continue gabapentin   Unique was seen today for follow-up.  Diagnoses and all orders for this  visit:  Thyroid nodule  Tobacco use  Tinnitus of both ears -     Ambulatory referral to ENT  Presbycusis of both ears -     Ambulatory referral to ENT  Mixed dyslipidemia  Essential hypertension, benign  Conductive hearing loss, bilateral -     Ambulatory referral to ENT  Elevated red blood cell count  Chronic otitis media of both ears with effusion -     Ambulatory referral to ENT  Paresthesia of both lower extremities  Numbness and tingling in left arm  Other orders -     meclizine  (ANTIVERT) 25 MG tablet; Take 1 tablet (25 mg total) by mouth daily. -     atorvastatin (LIPITOR) 20 MG tablet; Take 1 tablet (20 mg total) by mouth daily.    Follow up: 61mo

## 2022-02-18 IMAGING — US US THYROID
1 series · 12 of 25 positions shown · non-contrast
Comparison: None.

CLINICAL DATA: Nodule

EXAM:
THYROID ULTRASOUND
TECHNIQUE: Ultrasound examination of the thyroid gland and adjacent soft
tissues was performed.

[Series 1: us thyroid · 0.07mm/px · 12 of 44 slices shown]
[im 2/44]
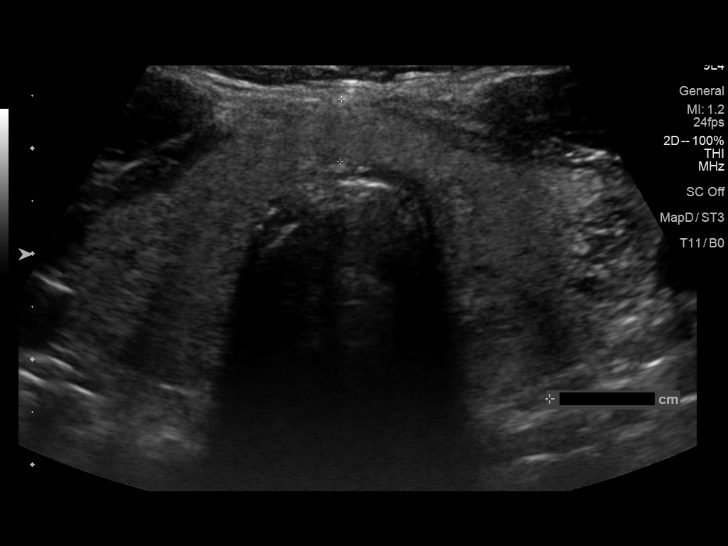
[im 6/44]
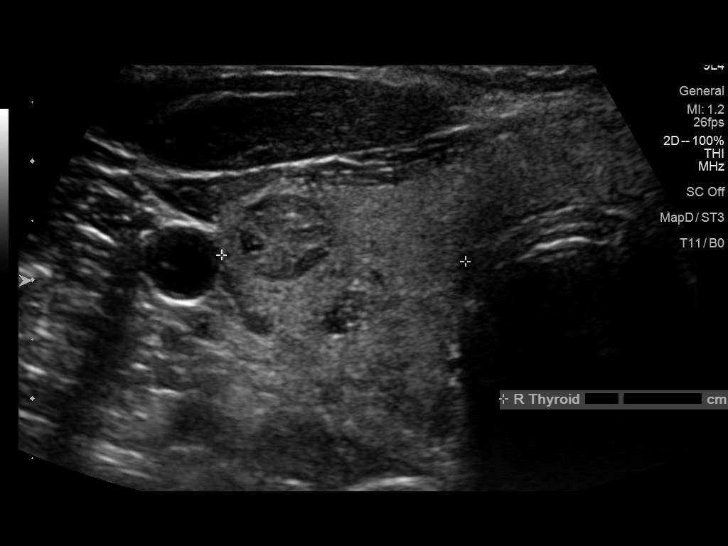
[im 9/44]
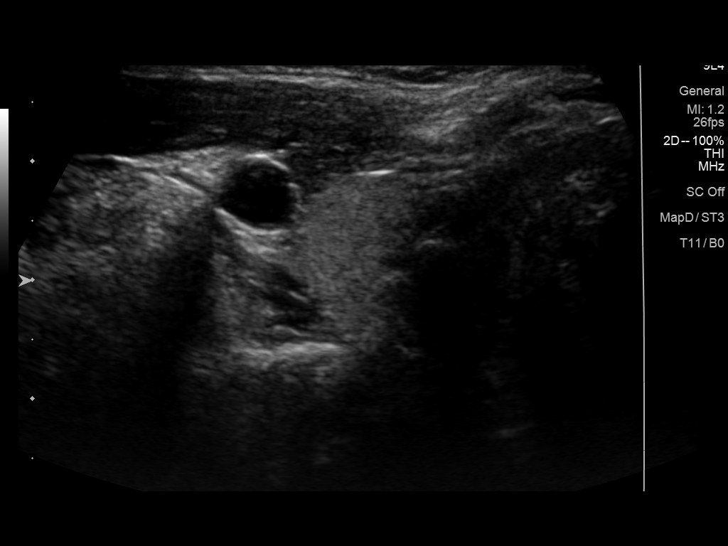
[im 13/44]
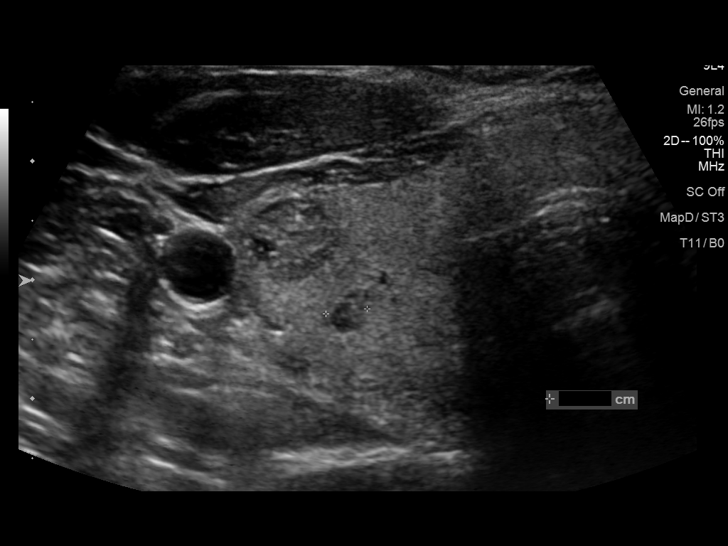
[im 17/44]
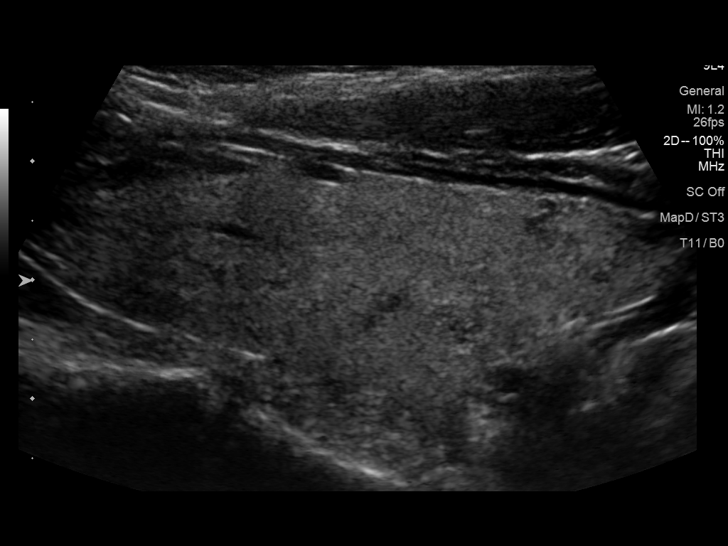
[im 20/44]
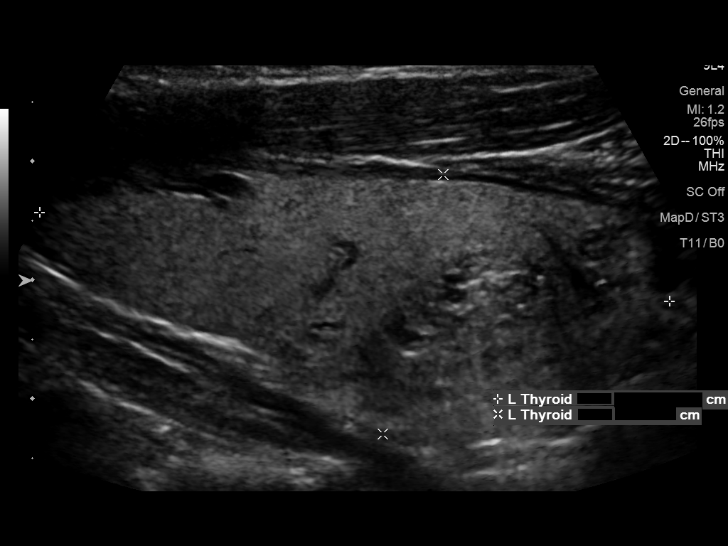
[im 24/44]
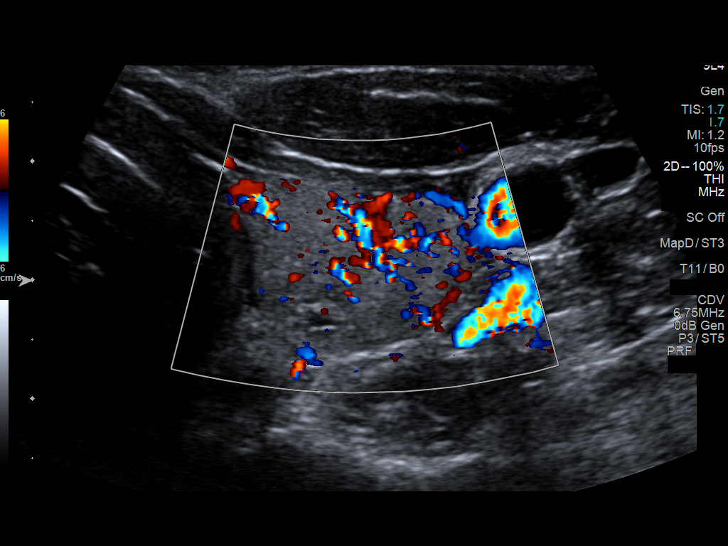
[im 27/44]
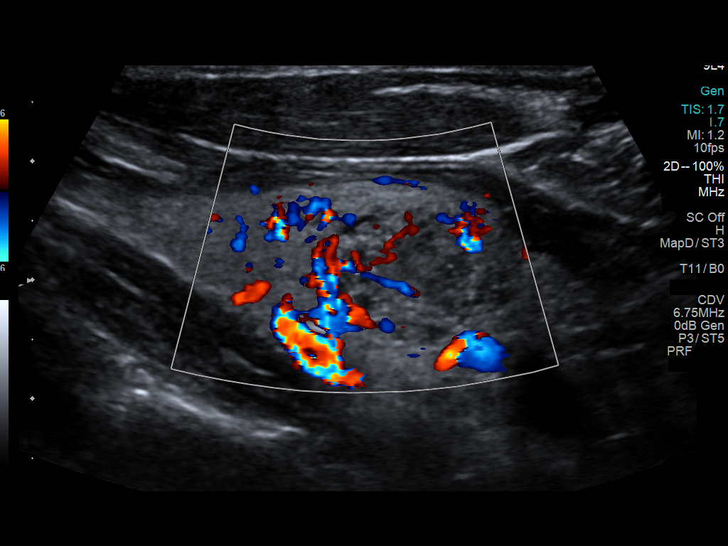
[im 31/44]
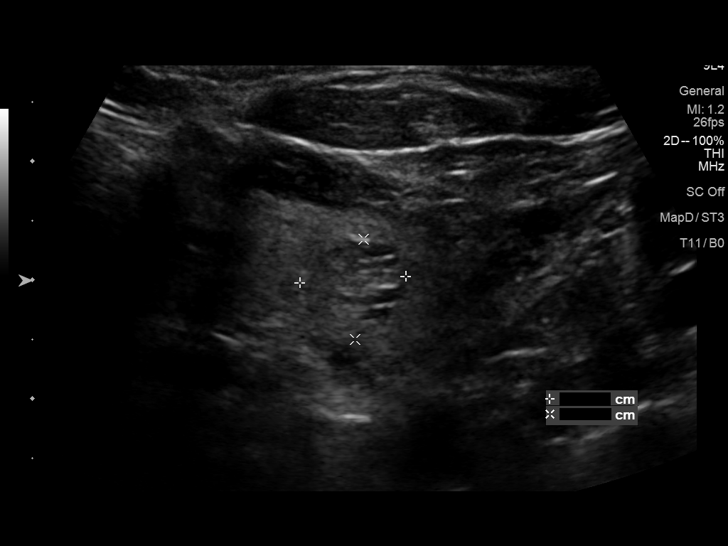
[im 35/44]
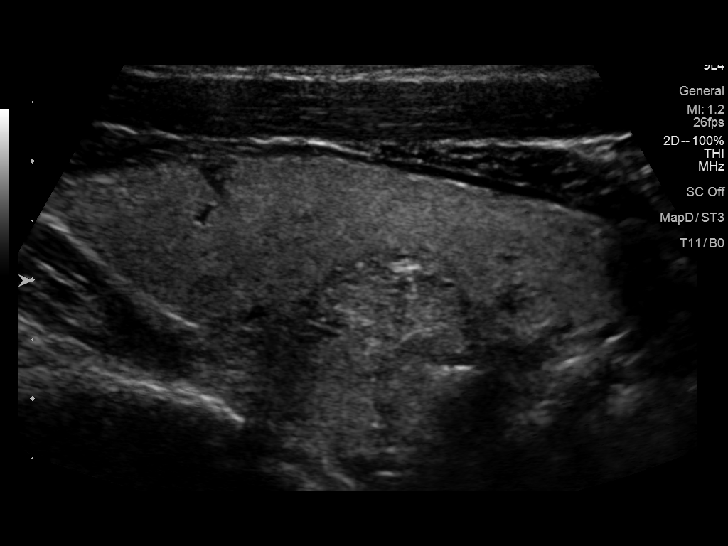
[im 38/44]
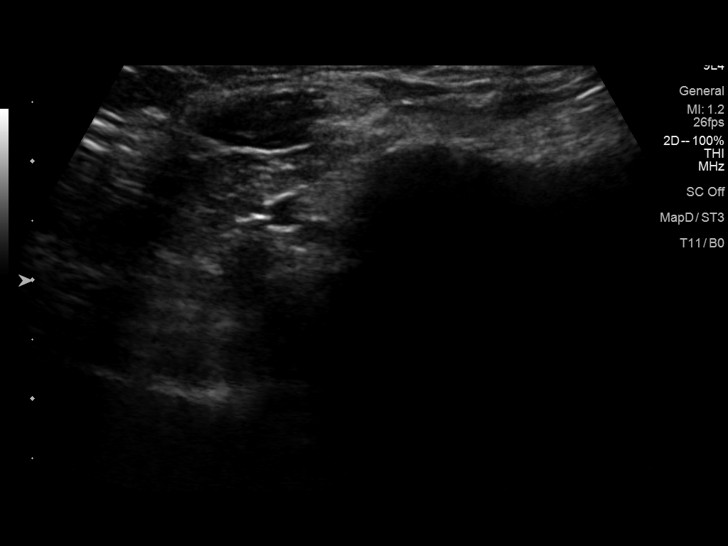
[im 42/44]
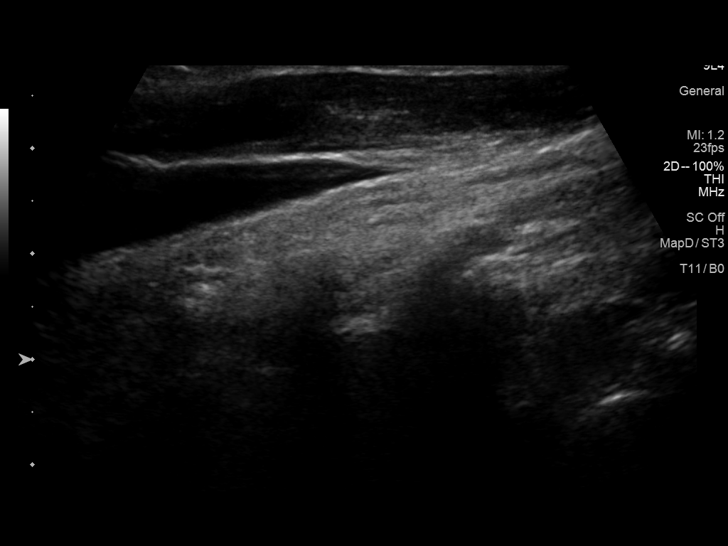

[12 of 25 positions shown; findings below may reference images not displayed]

FINDINGS: Parenchymal Echotexture: Mildly heterogeneous

Isthmus: 0.6 cm

Right lobe: 4.8 x 2.5 x 2.1 cm

Left lobe: 5.4 x 2.2 x 2.3 cm

_________________________________________________________

Estimated total number of nodules >/= 1 cm: 4

Number of spongiform nodules >/=  2 cm not described below (TR1): 0

Number of mixed cystic and solid nodules >/= 1.5 cm not described
below (TR2): 0

_________________________________________________________

Nodule # 1:

Location: Isthmus; right

Maximum size: 1.0 cm; Other 2 dimensions: 1.0 x 1.0 cm

Composition: solid/almost completely solid (2)

Echogenicity: hypoechoic (2)

Shape: not taller-than-wide (0)

Margins: smooth (0)

Echogenic foci: none (0)

ACR TI-RADS total points: 4.

ACR TI-RADS risk category: TR4 (4-6 points).

ACR TI-RADS recommendations:

*Given size (>/= 1 - 1.4 cm) and appearance, a follow-up ultrasound
in 1 year should be considered based on TI-RADS criteria.

_________________________________________________________

Nodule # 2:

Location: Right; mid

Maximum size: 1.2 cm; Other 2 dimensions: 0.9 x 0.7 cm

Composition: solid/almost completely solid (2)

Echogenicity: isoechoic (1)

Shape: not taller-than-wide (0)

Margins: smooth (0)

Echogenic foci: none (0)

ACR TI-RADS total points: 3.

ACR TI-RADS risk category: TR3 (3 points).

ACR TI-RADS recommendations:

Given size (<1.4 cm) and appearance, this nodule does NOT meet
TI-RADS criteria for biopsy or dedicated follow-up.

_________________________________________________________

Nodule # 3:

Location: Left; mid

Maximum size: 1.7 cm; Other 2 dimensions: 1.3 x 1.0 cm

Composition: solid/almost completely solid (2)

Echogenicity: isoechoic (1)

Shape: not taller-than-wide (0)

Margins: smooth (0)

Echogenic foci: none (0)

ACR TI-RADS total points: 3.

ACR TI-RADS risk category: TR3 (3 points).

ACR TI-RADS recommendations:

*Given size (>/= 1.5 - 2.4 cm) and appearance, a follow-up
ultrasound in 1 year should be considered based on TI-RADS criteria.

_________________________________________________________

Nodule # 4:

Location: Left; inferior

Maximum size: 1.8 cm; Other 2 dimensions: 1.5 x 1.0 cm

Composition: solid/almost completely solid (2)

Echogenicity: isoechoic (1)

Shape: not taller-than-wide (0)

Margins: ill-defined (0)

Echogenic foci: none (0)

ACR TI-RADS total points: 3.

ACR TI-RADS risk category: TR3 (3 points).

ACR TI-RADS recommendations:

*Given size (>/= 1.5 - 2.4 cm) and appearance, a follow-up
ultrasound in 1 year should be considered based on TI-RADS criteria.

_________________________________________________________

Nodule # 5:

Location: Left; inferior

Maximum size: 0.9 cm; Other 2 dimensions: 0.9 x 0.8 cm

Composition: solid/almost completely solid (2)

Echogenicity: isoechoic (1)

Shape: not taller-than-wide (0)

Margins: ill-defined (0)

Echogenic foci: none (0)

ACR TI-RADS total points: 3.

ACR TI-RADS risk category: TR3 (3 points).

ACR TI-RADS recommendations:

Given size (<1.4 cm) and appearance, this nodule does NOT meet
TI-RADS criteria for biopsy or dedicated follow-up.

_________________________________________________________
IMPRESSION: Nodules 1, 3, and 4 meet criteria for imaging follow-up.

Annual ultrasound surveillance is recommended until 5 years of
stability is documented.

The above is in keeping with the ACR TI-RADS recommendations - [HOSPITAL] 6601;[DATE].

## 2022-06-24 ENCOUNTER — Ambulatory Visit (INDEPENDENT_AMBULATORY_CARE_PROVIDER_SITE_OTHER): Payer: 59 | Admitting: Medical

## 2022-06-24 ENCOUNTER — Encounter: Payer: Self-pay | Admitting: Medical

## 2022-06-24 VITALS — BP 120/70 | HR 65 | Wt 133.4 lb

## 2022-06-24 DIAGNOSIS — I10 Essential (primary) hypertension: Secondary | ICD-10-CM

## 2022-06-24 DIAGNOSIS — Z720 Tobacco use: Secondary | ICD-10-CM

## 2022-06-24 DIAGNOSIS — R0609 Other forms of dyspnea: Secondary | ICD-10-CM

## 2022-06-24 DIAGNOSIS — Z789 Other specified health status: Secondary | ICD-10-CM

## 2022-06-24 DIAGNOSIS — E782 Mixed hyperlipidemia: Secondary | ICD-10-CM

## 2022-06-24 DIAGNOSIS — R5383 Other fatigue: Secondary | ICD-10-CM | POA: Insufficient documentation

## 2022-06-24 NOTE — Progress Notes (Signed)
Subjective:  David Shepherd is a 56 y.o. male who presents for Chief Complaint  Patient presents with   6 month follow-up    6 month follow-up, when walking he gets tired easily, doesn't happen everyday but especially when walking uphill. Sometimes have pain in shoulder     Here for med check  Here with interpreter from [facility]. David Shepherd.  Medical team: Dr. Windell Norfolk, neurology Dr. Virl Diamond, pulmonology Santiago Glad, NP, hematology/oncology Maliea Grandmaison, Kermit Balo, PA-C here for primary care   Overall doing ok.    Been a long time smoker.  Gets "fatigued" with long walks or walking up hill.  Having some pains in left shoulder.  No chest pain, no wheezing.   No specific SOB.  Started smoking around age 65.   Smokes 10+ cigarettes daily.  Headaches come and go, no worse of late.    compliant with blood pressure and cholesterol medication  Compliant with gabapentin for headaches.   No other aggravating or relieving factors.    No other c/o.  Past Medical History:  Diagnosis Date   Hyperlipidemia    Hypertension    Tension headache    Tobacco use    Current Outpatient Medications on File Prior to Visit  Medication Sig Dispense Refill   amLODipine (NORVASC) 5 MG tablet Take 1 tablet (5 mg total) by mouth daily. 90 tablet 3   atorvastatin (LIPITOR) 20 MG tablet Take 1 tablet (20 mg total) by mouth daily. 90 tablet 3   gabapentin (NEURONTIN) 100 MG capsule Take 2 capsules (200 mg total) by mouth at bedtime. 60 capsule 3   meclizine (ANTIVERT) 25 MG tablet Take 1 tablet (25 mg total) by mouth daily. 30 tablet 1   No current facility-administered medications on file prior to visit.     The following portions of the patient's history were reviewed and updated as appropriate: allergies, current medications, past family history, past medical history, past social history, past surgical history and problem list.  ROS Otherwise as in subjective  above  Objective: BP 120/70   Pulse 65   Wt 133 lb 6.4 oz (60.5 kg)   SpO2 99%   BMI 26.05 kg/m   General appearance: alert, no distress, well developed, well nourished HEENT: normocephalic, sclerae anicteric, conjunctiva pink and moist, TMs pearly, nares patent, no discharge or erythema, pharynx normal Oral cavity: MMM, no lesions Neck: supple, no lymphadenopathy, no thyromegaly, no masses Heart: RRR, normal S1, S2, no murmurs Lungs: CTA bilaterally, no wheezes, rhonchi, or rales Abdomen: +bs, soft, non tender, non distended, no masses, no hepatomegaly, no splenomegaly Pulses: 2+ radial pulses, 2+ pedal pulses, normal cap refill Ext: no edema  EKG reviewed   Assessment: Encounter Diagnoses  Name Primary?   DOE (dyspnea on exertion) Yes   Language barrier    Tobacco use    Mixed dyslipidemia    Essential hypertension, benign    Other fatigue      Plan: Given your risk for heart disease which includes high blood pressure, tobacco use, high cholesterol and your current symptoms of fatigue I want you to see a heart doctor.   I will put in the referral.  Expect a phone call about scheduling this visit  I would also like to have an x-ray of your chest given the long history of tobacco use.  Please go to [facility] for your chest xray.   Their hours are 8am - 4:30 pm Monday - Friday.  Take your  insurance card with you.  [facility] [phone]  [address], [address] [place], [place] [address]  [address] [place], [place] [address]   Continue current medication   LORRIE was seen today for 6 month follow-up.  Diagnoses and all orders for this visit:  DOE (dyspnea on exertion) -     EKG 12-Lead -     DG Chest 2 View; Future -     CBC with Differential/Platelet -     Basic metabolic panel -     Ambulatory referral to Cardiology  Language barrier  Tobacco use -     DG Chest 2 View; Future -     Ambulatory referral to Cardiology  Mixed  dyslipidemia  Essential hypertension, benign -     CBC with Differential/Platelet -     Basic metabolic panel  Other fatigue -     CBC with Differential/Platelet -     Basic metabolic panel -     Ambulatory referral to Cardiology    Follow up: pending cardiology consult

## 2022-06-24 NOTE — Patient Instructions (Signed)
Given your risk for heart disease which includes high blood pressure, tobacco use, high cholesterol and your current symptoms of fatigue I want you to see a heart doctor.   I will put in the referral.  Expect a phone call about scheduling this visit  I would also like to have an x-ray of your chest given the long history of tobacco use.  Please go to [facility] for your chest xray.   Their hours are 8am - 4:30 pm Monday - Friday.  Take your insurance card with you.  [facility] [phone]  [address], [address] [place], [place] [address]  [address] [place], [place] [address]   Continue current medication   V?i nguy c? m?c b?nh tim bao g?m huy?t p cao, s? d?ng thu?c l, cholesterol cao v cc tri?u ch?ng m?t m?i hi?n t?i c?a b?n, ti mu?n b?n ?i khm bc s? tim m?ch.  Ti s? ??a vo gi?i thi?u. Mong ??i m?t cu?c g?i ?i?n tho?i v? vi?c s?p x?p chuy?n th?m ny  Ti c?ng mu?n ch?p X-quang ng?c c?a b?n do ti?n s? s? d?ng thu?c l lu n?m.  Vui lng ??n [facility] ?? ch?p xquang ng?c. Gi? c?a h? l 8 gi? sng - 4:30 chi?u t? Th? Hai ??n Th? Su. Mang theo th? b?o hi?m c?a b?n.  [facility] [phone]  [address] [place], [place] [address]  [address] [place], [place] [address]   Ti?p t?c dng thu?c hi?n t?i

## 2022-06-25 LAB — CBC WITH DIFFERENTIAL/PLATELET
Basophils Absolute: 0.1 10*3/uL (ref 0.0–0.2)
Basos: 1 %
EOS (ABSOLUTE): 0.2 10*3/uL (ref 0.0–0.4)
Eos: 2 %
Hematocrit: 51.7 % — ABNORMAL HIGH (ref 37.5–51.0)
Hemoglobin: 15.6 g/dL (ref 13.0–17.7)
Immature Grans (Abs): 0 10*3/uL (ref 0.0–0.1)
Immature Granulocytes: 1 %
Lymphocytes Absolute: 2.9 10*3/uL (ref 0.7–3.1)
Lymphs: 34 %
MCH: 22 pg — ABNORMAL LOW (ref 26.6–33.0)
MCHC: 30.2 g/dL — ABNORMAL LOW (ref 31.5–35.7)
MCV: 73 fL — ABNORMAL LOW (ref 79–97)
Monocytes Absolute: 0.8 10*3/uL (ref 0.1–0.9)
Monocytes: 10 %
Neutrophils Absolute: 4.4 10*3/uL (ref 1.4–7.0)
Neutrophils: 52 %
Platelets: 203 10*3/uL (ref 150–450)
RBC: 7.1 x10E6/uL (ref 4.14–5.80)
RDW: 16.7 % — ABNORMAL HIGH (ref 11.6–15.4)
WBC: 8.4 10*3/uL (ref 3.4–10.8)

## 2022-06-25 LAB — BASIC METABOLIC PANEL
BUN/Creatinine Ratio: 11 (ref 9–20)
BUN: 9 mg/dL (ref 6–24)
CO2: 24 mmol/L (ref 20–29)
Calcium: 9.4 mg/dL (ref 8.7–10.2)
Chloride: 103 mmol/L (ref 96–106)
Creatinine, Ser: 0.85 mg/dL (ref 0.76–1.27)
Glucose: 79 mg/dL (ref 70–99)
Potassium: 3.8 mmol/L (ref 3.5–5.2)
Sodium: 139 mmol/L (ref 134–144)
eGFR: 102 mL/min/{1.73_m2} (ref >59)

## 2022-07-01 NOTE — Progress Notes (Signed)
Cardiology Office Note:    Date:  07/02/2022   ID:  David Shepherd, DOB 02-Mar-1966, MRN [id number]  PCP:  David Canavan, PA-C   [facility] Providers Cardiologist:  David Skeans, MD Referring MD: David Canavan, PA-C   Chief Complaint/Reason for Referral: Dyspnea  ASSESSMENT:    1. Fatigue, unspecified type   2. Hyperlipidemia, unspecified hyperlipidemia type   3. Hypertension, unspecified type   4. Tobacco use   5. Precordial pain   6. Dyspnea, unspecified type     PLAN:    In order of problems listed above: 1.  Fatigue: We will obtain echocardiogram to evaluate further.  We will check a reflex TSH.  If his echocardiogram demonstrates LV dysfunction we may pursue coronary angiography study.  Follow-up 6 months or earlier if needed. 2.  Hyperlipidemia: This is being followed by the patient's primary care provider. 3.  Hypertension:  BP is well controlled today. 4.  Tobacco use: Given the patient's long time tobacco use it may be important to assess his PFTs. 5.  Chest pain: We will obtain a coronary CTA and echocardiogram to evaluate further.  If the patient has mild obstructive coronary artery disease, they will require a statin (with goal LDL < 70) and aspirin, if they have high-grade disease we will need to consider optimal medical therapy and if symptoms are refractory to medical therapy, then a cardiac catheterization with possible PCI will be pursued to alleviate symptoms.  If they have high risk disease we will proceed directly to cardiac catheterization.   6.  Dyspnea: Will obtain TTE to evaluate further.            Dispo:  Return in about 6 months (around 01/02/2023).      Medication Adjustments/Labs and Tests Ordered: Current medicines are reviewed at length with the patient today.  Concerns regarding medicines are outlined above.  The following changes have been made:  no change   Labs/tests ordered: Orders Placed This Encounter  Procedures   CT  CORONARY MORPH W/CTA COR W/SCORE W/CA W/CM &/OR WO/CM   TSH Rfx on Abnormal to Free T4   ECHOCARDIOGRAM COMPLETE    Medication Changes: Meds ordered this encounter  Medications   metoprolol tartrate (LOPRESSOR) 100 MG tablet    Sig: Take 1 tablet (100 mg total) by mouth once for 1 dose. Take 1 tablet (100 mg total) two hours prior to CT scan.    Dispense:  1 tablet    Refill:  0     Current medicines are reviewed at length with the patient today.  The patient does not have concerns regarding medicines.   History of Present Illness:    FOCUSED PROBLEM LIST:   1.  Hypertension 2.  Hyperlipidemia 3.  Tobacco abuse  The patient is a 56 y.o. male with the indicated medical history here for recommendations regarding dyspnea.  The patient was seen by his primary care provider recently.  He denied any chest pain or wheezing as well as no shortness of breath however he says he gets fatigued when he takes long walks or walking uphill.  He had an EKG done which was reassuring.  A chest x-ray was also ordered but has not yet been done.  Laboratories were also unremarkable.  Through his interpreter the patient tells me that he has been fatigued over the last 6 months.  He also has developed increasing dyspnea on exertion.  He also has chest pain that occurs at rest and with  exertion.  It seems to be worse with exertion.  Rest seems to help it.  He does report some orthopnea but no peripheral edema, paroxysmal nocturnal dyspnea, or severe bleeding.  He does get lightheaded when he goes from sitting to standing quickly.  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  He does continue to smoke.        Current Medications: Current Meds  Medication Sig   amLODipine (NORVASC) 5 MG tablet Take 1 tablet (5 mg total) by mouth daily.   atorvastatin (LIPITOR) 20 MG tablet Take 1 tablet (20 mg total) by mouth daily.   meclizine (ANTIVERT) 25  MG tablet Take 1 tablet (25 mg total) by mouth daily.   metoprolol tartrate (LOPRESSOR) 100 MG tablet Take 1 tablet (100 mg total) by mouth once for 1 dose. Take 1 tablet (100 mg total) two hours prior to CT scan.     Allergies:    Amlodipine and Hctz [hydrochlorothiazide]   Social History:   Social History   Tobacco Use   Smoking status: Every Day    Packs/day: 1.00    Years: 35.00    Total pack years: 35.00    Types: Cigarettes   Smokeless tobacco: Never   Tobacco comments:    1-2 packs smoked daily 11/13/20 [facility]   Vaping Use   Vaping Use: Never used  Substance Use Topics   Alcohol use: Not Currently    Comment: occasional   Drug use: Not Currently     Family Hx: Family History  Family history unknown: Yes     Review of Systems:   Please see the history of present illness.    All other systems reviewed and are negative.     EKGs/Labs/Other Test Reviewed:    EKG:  EKG performed June 2023 that I personally reviewed demonstrates sinus rhythm.  Prior CV studies: None available  Other studies Reviewed: Review of the additional studies/records demonstrates: None relevant  Recent Labs: 09/15/2021: ALT 22 06/24/2022: BUN 9; Creatinine, Ser 0.85; Hemoglobin 15.6; Platelets 203; Potassium 3.8; Sodium 139   Recent Lipid Panel Lab Results  Component Value Date/Time   CHOL 99 (L) 09/15/2021 10:30 AM   TRIG 100 09/15/2021 10:30 AM   HDL 28 (L) 09/15/2021 10:30 AM   LDLCALC 52 09/15/2021 10:30 AM    Risk Assessment/Calculations:           Physical Exam:    VS:  BP 126/88   Pulse 88   Ht 5' (1.524 m)   Wt 130 lb 9.6 oz (59.2 kg)   SpO2 99%   BMI 25.51 kg/m    Wt Readings from Last 3 Encounters:  07/02/22 130 lb 9.6 oz (59.2 kg)  06/24/22 133 lb 6.4 oz (60.5 kg)  12/24/21 133 lb 3.2 oz (60.4 kg)    GENERAL:  No apparent distress, AOx3 HEENT:  No carotid bruits, +2 carotid impulses, no scleral icterus CAR: RRR no murmurs, gallops, rubs, or thrills RES:   Clear to auscultation bilaterally ABD:  Soft, nontender, nondistended, positive bowel sounds x 4 VASC:  +2 radial pulses, +2 carotid pulses, palpable pedal pulses NEURO:  CN 2-12 grossly intact; motor and sensory grossly intact PSYCH:  No active depression or anxiety EXT:  No edema, ecchymosis, or cyanosis  Signed, Orbie Pyo, MD  07/02/2022 10:12 AM    [facility] [address] [place], [place], [place]  [address] Phone: [phone];  Fax: [phone]   Note:  This document was prepared using Dragon voice recognition software and may include unintentional dictation errors.

## 2022-07-02 ENCOUNTER — Ambulatory Visit (INDEPENDENT_AMBULATORY_CARE_PROVIDER_SITE_OTHER): Payer: Self-pay | Admitting: Internal Medicine

## 2022-07-02 ENCOUNTER — Encounter: Payer: Self-pay | Admitting: Internal Medicine

## 2022-07-02 ENCOUNTER — Ambulatory Visit: Payer: 59

## 2022-07-02 VITALS — BP 126/88 | HR 88 | Ht 60.0 in | Wt 130.6 lb

## 2022-07-02 DIAGNOSIS — R5383 Other fatigue: Secondary | ICD-10-CM

## 2022-07-02 DIAGNOSIS — I10 Essential (primary) hypertension: Secondary | ICD-10-CM

## 2022-07-02 DIAGNOSIS — R072 Precordial pain: Secondary | ICD-10-CM

## 2022-07-02 DIAGNOSIS — E785 Hyperlipidemia, unspecified: Secondary | ICD-10-CM

## 2022-07-02 DIAGNOSIS — Z720 Tobacco use: Secondary | ICD-10-CM

## 2022-07-02 DIAGNOSIS — R0600 Dyspnea, unspecified: Secondary | ICD-10-CM

## 2022-07-02 LAB — TSH RFX ON ABNORMAL TO FREE T4: TSH: 1.84 u[IU]/mL (ref 0.450–4.500)

## 2022-07-02 MED ORDER — METOPROLOL TARTRATE 100 MG PO TABS: 100.0000 mg | ORAL_TABLET | Freq: Once | ORAL | 0 refills | Status: DC

## 2022-07-02 NOTE — Patient Instructions (Addendum)
Medication Instructions:  Your physician recommends that you continue on your current medications as directed. Please refer to the Current Medication list given to you today.  *If you need a refill on your cardiac medications before your next appointment, please call your pharmacy*   Lab Work: TSH Rfx If you have labs (blood work) drawn today and your tests are completely normal, you will receive your results only by: MyChart Message (if you have MyChart) OR A paper copy in the mail If you have any lab test that is abnormal or we need to change your treatment, we will call you to review the results.   Testing/Procedures: Echo Your physician has requested that you have an echocardiogram. Echocardiography is a painless test that uses sound waves to create images of your heart. It provides your doctor with information about the size and shape of your heart and how well your heart's chambers and valves are working. This procedure takes approximately one hour. There are no restrictions for this procedure.   Cardiac CT Your physician has requested that you have cardiac CT. Cardiac computed tomography (CT) is a painless test that uses an x-ray machine to take clear, detailed pictures of your heart. For further information please visit [email or web address] Please follow instruction sheet as given.     Follow-Up: At [facility], you and your health needs are our priority.  As part of our continuing mission to provide you with exceptional heart care, we have created designated Provider Care Teams.  These Care Teams include your primary Cardiologist (physician) and Advanced Practice Providers (APPs -  Physician Assistants and Nurse Practitioners) who all work together to provide you with the care you need, when you need it.  We recommend signing up for the patient portal called "MyChart".  Sign up information is provided on this After Visit Summary.  MyChart is used to connect with patients for  Virtual Visits (Telemedicine).  Patients are able to view lab/test results, encounter notes, upcoming appointments, etc.  Non-urgent messages can be sent to your provider as well.   To learn more about what you can do with MyChart, go to [email or web address].    Your next appointment:   6 month(s)  The format for your next appointment:   In Person  Provider:   Orbie Pyo, MD     Other Instructions    Your cardiac CT will be scheduled at one of the below locations:   [facility] [address] [place], [place] [address] [phone]  Please arrive at the [facility] and Children's Entrance (Entrance C2) of [facility] 30 minutes prior to test start time. You can use the FREE valet parking offered at entrance C (encouraged to control the heart rate for the test)  Proceed to the [facility] Radiology Department (first floor) to check-in and test prep.  All radiology patients and guests should use entrance C2 at [facility], accessed from [facility], even though the hospital's physical address listed is [address]    Please follow these instructions carefully (unless otherwise directed):  Hold all erectile dysfunction medications at least 3 days (72 hrs) prior to test.  On the Night Before the Test: Be sure to Drink plenty of water. Do not consume any caffeinated/decaffeinated beverages or chocolate 12 hours prior to your test. Do not take any antihistamines 12 hours prior to your test.  On the Day of the Test: Drink plenty of water until 1 hour prior to  the test. Do not eat any food 4 hours prior to the test. You may take your regular medications prior to the test.  Take metoprolol (Lopressor) two hours prior to test.       After the Test: Drink plenty of water. After receiving IV contrast, you may experience a mild flushed feeling. This is normal. On occasion, you may experience a mild rash up to 24 hours after  the test. This is not dangerous. If this occurs, you can take Benadryl 25 mg and increase your fluid intake. If you experience trouble breathing, this can be serious. If it is severe call 911 IMMEDIATELY. If it is mild, please call our office.  We will call to schedule your test 2-4 weeks out understanding that some insurance companies will need an authorization prior to the service being performed.   For non-scheduling related questions, please contact the cardiac imaging nurse navigator should you have any questions/concerns: Rockwell Alexandria, Cardiac Imaging Nurse Navigator Larey Brick, Cardiac Imaging Nurse Navigator [facility] Direct Office Dial: [phone]   For scheduling needs, including cancellations and rescheduling, please call [place], [phone].

## 2022-07-20 ENCOUNTER — Ambulatory Visit (HOSPITAL_COMMUNITY): Payer: 59 | Attending: Internal Medicine

## 2022-07-20 DIAGNOSIS — R5383 Other fatigue: Secondary | ICD-10-CM | POA: Insufficient documentation

## 2022-07-20 DIAGNOSIS — R0600 Dyspnea, unspecified: Secondary | ICD-10-CM | POA: Insufficient documentation

## 2022-07-20 LAB — ECHOCARDIOGRAM COMPLETE
Area-P 1/2: 3.77 cm2
S' Lateral: 2.3 cm

## 2022-08-02 ENCOUNTER — Other Ambulatory Visit: Payer: Self-pay | Admitting: Neurology

## 2022-08-02 ENCOUNTER — Other Ambulatory Visit: Payer: Self-pay | Admitting: Medical

## 2022-08-03 NOTE — Telephone Encounter (Signed)
Please advise if this is ok to fil . It was fill seven month ago and pt advised he is out. Please advise [place]

## 2022-09-02 ENCOUNTER — Encounter (HOSPITAL_COMMUNITY): Payer: Self-pay | Admitting: *Deleted

## 2022-09-14 ENCOUNTER — Telehealth (HOSPITAL_COMMUNITY): Payer: Self-pay | Admitting: *Deleted

## 2022-09-14 ENCOUNTER — Other Ambulatory Visit (HOSPITAL_COMMUNITY): Payer: Self-pay | Admitting: *Deleted

## 2022-09-14 MED ORDER — METOPROLOL TARTRATE 100 MG PO TABS: 100.0000 mg | ORAL_TABLET | Freq: Once | ORAL | 0 refills | Status: DC

## 2022-09-14 NOTE — Telephone Encounter (Signed)
Reaching out to patient to offer assistance regarding upcoming cardiac imaging study; pt verbalizes understanding of appt date/time. Patient confirmed he is only allergic to amlodipine and HCTZ. Patient gave me permission to contact his niece to review instructions. Patient's niece is aware to when patient is to arrive and that patient will need to take 100mg  metoprolol two hours prior to his cardiac CT scan.  Gordy Clement RN Navigator Cardiac Imaging [facility] [phone] office [phone] cell  Interpreter services contacted and will a translator present for patient's 3:30pm arrival.

## 2022-09-15 ENCOUNTER — Ambulatory Visit (HOSPITAL_COMMUNITY)
Admission: RE | Admit: 2022-09-15 | Discharge: 2022-09-15 | Disposition: A | Discharge status: Home / Self Care | Payer: Commercial Managed Care - HMO | Source: Ambulatory Visit | Attending: Internal Medicine | Admitting: Internal Medicine

## 2022-09-15 DIAGNOSIS — R072 Precordial pain: Secondary | ICD-10-CM | POA: Diagnosis present

## 2022-09-15 MED ORDER — DILTIAZEM HCL 25 MG/5ML IV SOLN
INTRAVENOUS | Status: AC
  Filled 2022-09-15: qty 5

## 2022-09-15 MED ORDER — METOPROLOL TARTRATE 5 MG/5ML IV SOLN: 5.0000 mg | Freq: Once | INTRAVENOUS | Status: AC

## 2022-09-15 MED ORDER — IOHEXOL 350 MG/ML SOLN
100.0000 mL | Freq: Once | INTRAVENOUS | Status: AC | PRN
  Administered 2022-09-15: 100 mL via INTRAVENOUS

## 2022-09-15 MED ORDER — METOPROLOL TARTRATE 5 MG/5ML IV SOLN
5.0000 mg | Freq: Once | INTRAVENOUS | Status: AC
  Administered 2022-09-15: 5 mg via INTRAVENOUS

## 2022-09-15 MED ORDER — NITROGLYCERIN 0.4 MG SL SUBL
SUBLINGUAL_TABLET | SUBLINGUAL | Status: AC
  Filled 2022-09-15: qty 2

## 2022-09-15 MED ORDER — NITROGLYCERIN 0.4 MG SL SUBL
0.8000 mg | SUBLINGUAL_TABLET | Freq: Once | SUBLINGUAL | Status: AC
  Administered 2022-09-15: 0.8 mg via SUBLINGUAL

## 2022-09-15 MED ORDER — METOPROLOL TARTRATE 5 MG/5ML IV SOLN
INTRAVENOUS | Status: AC
  Filled 2022-09-15: qty 10

## 2022-09-15 MED ORDER — METOPROLOL TARTRATE 5 MG/5ML IV SOLN
10.0000 mg | Freq: Once | INTRAVENOUS | Status: AC
  Administered 2022-09-15: 10 mg via INTRAVENOUS

## 2022-09-15 MED ORDER — METOPROLOL TARTRATE 5 MG/5ML IV SOLN
INTRAVENOUS | Status: AC
  Administered 2022-09-15: 5 mg via INTRAVENOUS
  Filled 2022-09-15: qty 10

## 2022-09-15 MED ORDER — METOPROLOL TARTRATE 5 MG/5ML IV SOLN: 10.0000 mg | Freq: Once | INTRAVENOUS | Status: DC

## 2022-09-15 NOTE — Progress Notes (Signed)
Patient arrived for cardiac CT. Language interpretor present for [place]. Patient took pre medication Metoprolol 100mg  prior to arrival and stated drank a lot of water, feels tired because of the medication and urinating a lot. Patient urinated 3 times once before and twice after CT. Stated after scan feels tired and slight shaky. Returned to nurse station vital signs stable gave a warm blanket with coffee and water. Patient told nurse the with interpretor he feels good and ready to go home. Patient not shaky and ambulating with steady gait. Vital signs continue to be stable. Explained the doctor will explain his CT results. Patient verbalized understanding.

## 2022-09-17 ENCOUNTER — Ambulatory Visit (INDEPENDENT_AMBULATORY_CARE_PROVIDER_SITE_OTHER): Payer: Commercial Managed Care - HMO | Admitting: Medical

## 2022-09-17 VITALS — BP 122/82 | HR 76 | Ht 61.0 in | Wt 133.6 lb

## 2022-09-17 DIAGNOSIS — Z23 Encounter for immunization: Secondary | ICD-10-CM | POA: Diagnosis not present

## 2022-09-17 DIAGNOSIS — Z720 Tobacco use: Secondary | ICD-10-CM

## 2022-09-17 DIAGNOSIS — Z131 Encounter for screening for diabetes mellitus: Secondary | ICD-10-CM

## 2022-09-17 DIAGNOSIS — Z0000 Encounter for general adult medical examination without abnormal findings: Secondary | ICD-10-CM

## 2022-09-17 DIAGNOSIS — R718 Other abnormality of red blood cells: Secondary | ICD-10-CM | POA: Diagnosis not present

## 2022-09-17 DIAGNOSIS — I10 Essential (primary) hypertension: Secondary | ICD-10-CM

## 2022-09-17 DIAGNOSIS — Z125 Encounter for screening for malignant neoplasm of prostate: Secondary | ICD-10-CM

## 2022-09-17 DIAGNOSIS — E782 Mixed hyperlipidemia: Secondary | ICD-10-CM

## 2022-09-17 DIAGNOSIS — E041 Nontoxic single thyroid nodule: Secondary | ICD-10-CM

## 2022-09-17 DIAGNOSIS — R0789 Other chest pain: Secondary | ICD-10-CM

## 2022-09-17 MED ORDER — MECLIZINE HCL 25 MG PO TABS: 25.0000 mg | ORAL_TABLET | Freq: Two times a day (BID) | ORAL | 2 refills | Status: DC | PRN

## 2022-09-17 MED ORDER — AMLODIPINE BESYLATE 5 MG PO TABS: 5.0000 mg | ORAL_TABLET | Freq: Every day | ORAL | 3 refills | Status: DC

## 2022-09-17 MED ORDER — ATORVASTATIN CALCIUM 20 MG PO TABS: 20.0000 mg | ORAL_TABLET | Freq: Every day | ORAL | 3 refills | Status: DC

## 2022-09-17 NOTE — Patient Instructions (Addendum)
Recommendations:   Eye doctors nearby  [facility] [address] [place], [place] [address] [phone]  Dr. Blair Heys [address] [place], [place] [address] [phone]  [facility] [address] Jacinto Reap [place], [place] [address] [phone]  [facility], Dr. Idolina Primer [address], [place], [place] [address] [phone]    Dentists nearby Dr. Jonna Coup, dentist [address] [place], [place] [address] [phone] [email or web address]    Vaccines: We updated your flu shot today and pneumonia shot.  I recommend pneumonia and shingrles vaccine  Shingles vaccine:  I recommend you have a shingles vaccine to help prevent shingles or herpes zoster outbreak.   Please call your insurer to inquire about coverage for the Shingrix vaccine given in 2 doses.   Some insurers cover this vaccine after age 36, some cover this after age 109.  If your insurer covers this, then call to schedule appointment to have this vaccine here.     Referral placed today for yearly ultrasound of thyroid nodule    Continue your current medicaiton as usual, refills sent   We will call with lab results       Khuy?n ngh?:   Bc s? m?t g?n ?  ??c s?n t?m [address] [place], [place] [address] [phone]  Ti?n s? Blair Heys [address] [place], [place] [address] [phone]  Ch?m [place] m?t chi?n tr??ng [place], [place] [address] [phone]  Ch?m [place] m?t gia ?nh, Ti?n s? [place], [place], [place] [address] [phone]    Nha s? ln c?n Ti?n s? Jonna Coup, nha s? [address], [place], [place] [address] [phone] [email or web address]    V?c-xin: Chng ti ? c?p nh?t m?i tim phng cm v vim ph?i c?a b?n ngy hm nay.  Ti khuyn b?n nn ch?ng ng?a b?nh vim ph?i v b?nh zona  V?c-xin b?nh zona: Ti khuyn b?n nn tim v?c-xin b?nh zona  ?? gip ng?n ng?a bng pht b?nh zona ho?c herpes zoster. Vui lng g?i cho cng ty b?o hi?m c?a b?n ?? h?i v? ph?m vi b?o hi?m cho v?c xin Shingrix ???c tim 2 li?u. M?t s? cng ty b?o hi?m chi tr? kho?n v?c xin ny sau 50 tu?i, m?t s? chi tr? kho?n ny sau 60 tu?i. N?u cng ty b?o hi?m c?a b?n chi tr? kho?n ny, hy g?i ?i?n ?? ??t l?ch h?n tim v?c xin ny t?i ?y.     Gi?i thi?u ???c th?c hi?n ngay hm nay ?? siu m nhn tuy?n gip hng n?m    Ti?p t?c dng thu?c hi?n t?i c?a b?n nh? bnh th??ng, g?i thu?c b? sung   Chng ti s? g?i ?? thng bo k?t qu? xt nghi?m

## 2022-09-17 NOTE — Progress Notes (Signed)
Subjective:   HPI  David Shepherd is a 56 y.o. male who presents for Chief Complaint  Patient presents with   nonfasting cpe    Nonfasting cpe, no concerns. Would like flu shot    Here with interpreter  Y Phi   Patient Care Team: Verble Styron, Camelia Eng, PA-C as PCP - General (Family Medicine) Early Osmond, MD as PCP - Cardiology (Cardiology) Truitt Merle, MD as Consulting Physician (Hematology) Dr. Alric Ran, neurology Dr. Sherrilyn Rist, pulmonology   Concerns: Hypertension - compliant with medicaiton without c/o.    Hyperlipidemia-compliant with Lipitor without complaint  Using meclizine prn which helps the dizziness he gets prn  Leg numbness improved since begin on gabapentin  reviewed their medical, surgical, family, social, medication, and allergy history and updated chart as appropriate.  Past Medical History:  Diagnosis Date   Hyperlipidemia    Hypertension    Tension headache    Tobacco use     Past Surgical History:  Procedure Laterality Date   NO PAST SURGERIES  11/2018    Family History  Family history unknown: Yes     Current Outpatient Medications:    gabapentin (NEURONTIN) 100 MG capsule, Take 2 capsules (200 mg total) by mouth at bedtime. (Patient taking differently: Take 100 mg by mouth at bedtime.), Disp: 60 capsule, Rfl: 3   meclizine (ANTIVERT) 25 MG tablet, Take 1 tablet (25 mg total) by mouth 2 (two) times daily as needed for dizziness., Disp: 60 tablet, Rfl: 2   amLODipine (NORVASC) 5 MG tablet, Take 1 tablet (5 mg total) by mouth daily., Disp: 90 tablet, Rfl: 3   atorvastatin (LIPITOR) 20 MG tablet, Take 1 tablet (20 mg total) by mouth daily., Disp: 90 tablet, Rfl: 3   meclizine (ANTIVERT) 25 MG tablet, TAKE 1 TABLET(25 MG) BY MOUTH DAILY, Disp: 30 tablet, Rfl: 0  Allergies  Allergen Reactions   Amlodipine Palpitations    At higher dose   Hctz [Hydrochlorothiazide] Other (See Comments)    Headache      Review of  Systems Constitutional: -fever, -chills, -sweats, -unexpected weight change, -decreased appetite, -fatigue Allergy: -sneezing, -itching, -congestion Dermatology: -changing moles, --rash, -lumps ENT: -runny nose, -ear pain, -sore throat, -hoarseness, -sinus pain, -teeth pain, - ringing in ears, -hearing loss, -nosebleeds Cardiology: -chest pain, -palpitations, -swelling, -difficulty breathing when lying flat, -waking up short of breath Respiratory: -cough, -shortness of breath, -difficulty breathing with exercise or exertion, -wheezing, -coughing up blood Gastroenterology: -abdominal pain, -nausea, -vomiting, -diarrhea, -constipation, -blood in stool, -changes in bowel movement, -difficulty swallowing or eating Hematology: -bleeding, -bruising  Musculoskeletal: -joint aches, -muscle aches, -joint swelling, -back pain, -neck pain, -cramping, -changes in gait Ophthalmology: denies vision changes, eye redness, itching, discharge Urology: -burning with urination, -difficulty urinating, -blood in urine, -urinary frequency, -urgency, -incontinence Neurology: -headache, -weakness, +tingling, -numbness, -memory loss, -falls, +dizziness Psychology: -depressed mood, -agitation, -sleep problems Male GU: no testicular mass, pain, no lymph nodes swollen, no swelling, no rash.     09/17/2022   10:57 AM 06/24/2022   10:00 AM 12/24/2021    3:56 PM 09/15/2021    9:45 AM 04/16/2021    4:25 PM  Depression screen PHQ 2/9  Decreased Interest 0 0 0 0 0  Down, Depressed, Hopeless 0 0 0 0 0  PHQ - 2 Score 0 0 0 0 0        Objective:  BP 122/82   Pulse 76   Ht 5\' 1"  (1.549 m)   Wt 133 lb 9.6  oz (60.6 kg)   BMI 25.24 kg/m   General appearance: alert, no distress, WD/WN, Asian male Skin: unremarkable HEENT: normocephalic, conjunctiva/corneas normal, sclerae anicteric, PERRLA, EOMi, nares patent, no discharge or erythema, pharynx normal Oral cavity: MMM, tongue normal, teeth unremarkable Neck: supple, no  lymphadenopathy, no thyromegaly, no masses, normal ROM, no bruits Chest: non tender, normal shape and expansion Heart: RRR, normal S1, S2, no murmurs Lungs: CTA bilaterally, no wheezes, rhonchi, or rales Abdomen: +bs, soft, non tender, non distended, no masses, no hepatomegaly, no splenomegaly, no bruits Back: non tender, normal ROM, no scoliosis Musculoskeletal: upper extremities non tender, no obvious deformity, normal ROM throughout, lower extremities non tender, no obvious deformity, normal ROM throughout Extremities: no edema, no cyanosis, no clubbing Pulses: 2+ symmetric, upper and lower extremities, normal cap refill Neurological: alert, oriented x 3, CN2-12 intact, strength normal upper extremities and lower extremities, sensation normal throughout, DTRs 2+ throughout, no cerebellar signs, gait normal Psychiatric: normal affect, behavior normal, pleasant  GU/rectal - deferred   Assessment and Plan :   Encounter Diagnoses  Name Primary?   Encounter for health maintenance examination in adult Yes   Needs flu shot    Need for pneumococcal vaccination    Elevated red blood cell count    Tobacco use    Thyroid nodule    Screening for prostate cancer    Mixed dyslipidemia    Essential hypertension, benign    Screening for diabetes mellitus    Chest discomfort     This visit was a preventative care visit, also known as wellness visit or routine physical.   Topics typically include healthy lifestyle, diet, exercise, preventative care, vaccinations, sick and well care, proper use of emergency dept and after hours care, as well as other concerns.     Recommendations: Continue to return yearly for your annual wellness and preventative care visits.  This gives [place] a chance to discuss healthy lifestyle, exercise, vaccinations, review your chart record, and perform screenings where appropriate.  I recommend you see your eye doctor yearly for routine vision care.  I recommend you see  your dentist yearly for routine dental care including hygiene visits twice yearly.   Vaccination recommendations were reviewed Immunization History  Administered Date(s) Administered   Influenza,inj,Quad PF,6+ Mos 03/07/2019, 09/17/2022   PFIZER(Purple Top)SARS-COV-2 Vaccination 07/05/2020, 07/26/2020   Pneumococcal Polysaccharide-23 09/17/2022   Tdap 03/07/2019   Counseled on the influenza virus vaccine.  Vaccine information sheet given.  Influenza vaccine given after consent obtained.  Shingles vaccine:  I recommend you have a shingles vaccine to help prevent shingles or herpes zoster outbreak.   Please call your insurer to inquire about coverage for the Shingrix vaccine given in 2 doses.   Some insurers cover this vaccine after age 6, some cover this after age 57.  If your insurer covers this, then call to schedule appointment to have this vaccine here.  I recommend pneumococcal vaccine as well, every 5 years.  Counseled on the pneumococcal vaccine.  Vaccine information sheet given.  Pneumococcal vaccine PPSV23 given after consent obtained.    Screening for cancer: Colon cancer screening: I reviewed your normal Cologard test from 2022.  You will be due to repeat this in 2025.  We discussed PSA, prostate exam, and prostate cancer screening risks/benefits.     Skin cancer screening: Check your skin regularly for new changes, growing lesions, or other lesions of concern Come in for evaluation if you have skin lesions of concern.  Lung cancer  screening: If you have a greater than 20 pack year history of tobacco use, then you may qualify for lung cancer screening with a chest CT scan.   Please call your insurance company to inquire about coverage for this test.  We currently don't have screenings for other cancers besides breast, cervical, colon, and lung cancers.  If you have a strong family history of cancer or have other cancer screening concerns, please let me know.    Bone  health: Get at least 150 minutes of aerobic exercise weekly Get weight bearing exercise at least once weekly Bone density test:  A bone density test is an imaging test that uses a type of X-ray to measure the amount of calcium and other minerals in your bones. The test may be used to diagnose or screen you for a condition that causes weak or thin bones (osteoporosis), predict your risk for a broken bone (fracture), or determine how well your osteoporosis treatment is working. The bone density test is recommended for females 8 and older, or females or males <79 if certain risk factors such as thyroid disease, long term use of steroids such as for asthma or rheumatological issues, vitamin D deficiency, estrogen deficiency, family history of osteoporosis, self or family history of fragility fracture in first degree relative.    Heart health: Get at least 150 minutes of aerobic exercise weekly Limit alcohol It is important to maintain a healthy blood pressure and healthy cholesterol numbers  Heart disease screening: Screening for heart disease includes screening for blood pressure, fasting lipids, glucose/diabetes screening, BMI height to weight ratio, reviewed of smoking status, physical activity, and diet.    Goals include blood pressure 120/80 or less, maintaining a healthy lipid/cholesterol profile, preventing diabetes or keeping diabetes numbers under good control, not smoking or using tobacco products, exercising most days per week or at least 150 minutes per week of exercise, and eating healthy variety of fruits and vegetables, healthy oils, and avoiding unhealthy food choices like fried food, fast food, high sugar and high cholesterol foods.    I reviewed his CT coronary test from this week 08/2022 that had 0 calcium score   Medical care options: I recommend you continue to seek care here first for routine care.  We try really hard to have available appointments Monday through Friday  daytime hours for sick visits, acute visits, and physicals.  Urgent care should be used for after hours and weekends for significant issues that cannot wait till the next day.  The emergency department should be used for significant potentially life-threatening emergencies.  The emergency department is expensive, can often have long wait times for less significant concerns, so try to utilize primary care, urgent care, or telemedicine when possible to avoid unnecessary trips to the emergency department.  Virtual visits and telemedicine have been introduced since the pandemic started in 2020, and can be convenient ways to receive medical care.  We offer virtual appointments as well to assist you in a variety of options to seek medical care.   Separate significant issues discussed: hypertension-continue current medication  Tobacco use-I strongly recommend you quit smoking as this puts you at risk for heart disease, cancer, lung disease, stroke and is the cause of your elevated red cells  High cholesterol/dyslipidemia-continue your cholesterol medication daily  Paresthessa of leg improved on gabapentin  intermittent dizziness/vertigo - does fine on meclizine  Thyroid nodule - referral for updated yearly [place] surveillance  Leopoldo was seen today for nonfasting cpe.  Diagnoses and all orders for this visit:  Encounter for health maintenance examination in adult -     PSA -     Lipid panel -     Cancel: POCT Urinalysis DIP (Proadvantage Device) -     US THYROID; Future -     Hemoglobin A1c -     CBC with Differential/Platelet -     Hepatic function panel -     HIV Antibody (routine testing w rflx) -     Hepatitis C antibody  Needs flu shot -     Flu Vaccine QUAD 25mo+IM (Fluarix, Fluzone & Alfiuria Quad PF)  Need for pneumococcal vaccination -     Pneumococcal polysaccharide vaccine 23-valent greater than or equal to 2yo subcutaneous/IM  Elevated red blood cell count -     CBC with  Differential/Platelet  Tobacco use -     Spirometry with Graph  Thyroid nodule -     US THYROID; Future  Screening for prostate cancer  Mixed dyslipidemia -     Lipid panel  Essential hypertension, benign  Screening for diabetes mellitus -     Hemoglobin A1c  Chest discomfort -     Spirometry with Graph  Other orders -     amLODipine (NORVASC) 5 MG tablet; Take 1 tablet (5 mg total) by mouth daily. -     atorvastatin (LIPITOR) 20 MG tablet; Take 1 tablet (20 mg total) by mouth daily. -     meclizine (ANTIVERT) 25 MG tablet; Take 1 tablet (25 mg total) by mouth 2 (two) times daily as needed for dizziness.    Follow-up pending labs, yearly for physical

## 2022-09-18 ENCOUNTER — Other Ambulatory Visit: Payer: Self-pay | Admitting: Medical

## 2022-09-18 LAB — HEPATIC FUNCTION PANEL
ALT: 14 IU/L (ref 0–44)
AST: 18 IU/L (ref 0–40)
Albumin: 4.4 g/dL (ref 3.8–4.9)
Alkaline Phosphatase: 65 IU/L (ref 44–121)
Bilirubin Total: 0.8 mg/dL (ref 0.0–1.2)
Bilirubin, Direct: 0.21 mg/dL (ref 0.00–0.40)
Total Protein: 7.4 g/dL (ref 6.0–8.5)

## 2022-09-18 LAB — CBC WITH DIFFERENTIAL/PLATELET
Basophils Absolute: 0.1 10*3/uL (ref 0.0–0.2)
Basos: 1 %
EOS (ABSOLUTE): 0.1 10*3/uL (ref 0.0–0.4)
Eos: 2 %
Hematocrit: 50.1 % (ref 37.5–51.0)
Hemoglobin: 15.2 g/dL (ref 13.0–17.7)
Immature Grans (Abs): 0 10*3/uL (ref 0.0–0.1)
Immature Granulocytes: 0 %
Lymphocytes Absolute: 2.5 10*3/uL (ref 0.7–3.1)
Lymphs: 34 %
MCH: 22.3 pg — ABNORMAL LOW (ref 26.6–33.0)
MCHC: 30.3 g/dL — ABNORMAL LOW (ref 31.5–35.7)
MCV: 74 fL — ABNORMAL LOW (ref 79–97)
Monocytes Absolute: 0.7 10*3/uL (ref 0.1–0.9)
Monocytes: 9 %
Neutrophils Absolute: 3.8 10*3/uL (ref 1.4–7.0)
Neutrophils: 54 %
Platelets: 190 10*3/uL (ref 150–450)
RBC: 6.82 x10E6/uL — ABNORMAL HIGH (ref 4.14–5.80)
RDW: 17.1 % — ABNORMAL HIGH (ref 11.6–15.4)
WBC: 7.2 10*3/uL (ref 3.4–10.8)

## 2022-09-18 LAB — LIPID PANEL
Chol/HDL Ratio: 3.1 ratio (ref 0.0–5.0)
Cholesterol, Total: 108 mg/dL (ref 100–199)
HDL: 35 mg/dL — ABNORMAL LOW (ref >39)
LDL Chol Calc (NIH): 49 mg/dL (ref 0–99)
Triglycerides: 137 mg/dL (ref 0–149)
VLDL Cholesterol Cal: 24 mg/dL (ref 5–40)

## 2022-09-18 LAB — PSA: Prostate Specific Ag, Serum: 1.1 ng/mL (ref 0.0–4.0)

## 2022-09-18 LAB — HEMOGLOBIN A1C
Est. average glucose Bld gHb Est-mCnc: 111 mg/dL
Hgb A1c MFr Bld: 5.5 % (ref 4.8–5.6)

## 2022-09-18 LAB — HEPATITIS C ANTIBODY: Hep C Virus Ab: NONREACTIVE

## 2022-09-18 LAB — HIV ANTIBODY (ROUTINE TESTING W REFLEX): HIV Screen 4th Generation wRfx: NONREACTIVE

## 2022-09-20 ENCOUNTER — Other Ambulatory Visit: Payer: Self-pay | Admitting: Neurology

## 2022-09-20 ENCOUNTER — Other Ambulatory Visit: Payer: Self-pay | Admitting: Family Medicine

## 2022-09-24 ENCOUNTER — Telehealth: Payer: Self-pay | Admitting: Internal Medicine

## 2022-09-24 ENCOUNTER — Ambulatory Visit
Admission: RE | Admit: 2022-09-24 | Discharge: 2022-09-24 | Disposition: A | Discharge status: Home / Self Care | Payer: Commercial Managed Care - HMO | Source: Ambulatory Visit | Attending: Medical | Admitting: Medical

## 2022-09-24 ENCOUNTER — Other Ambulatory Visit: Payer: Self-pay | Admitting: Medical

## 2022-09-24 DIAGNOSIS — Z0000 Encounter for general adult medical examination without abnormal findings: Secondary | ICD-10-CM

## 2022-09-24 DIAGNOSIS — E041 Nontoxic single thyroid nodule: Secondary | ICD-10-CM

## 2022-09-24 MED ORDER — GABAPENTIN 100 MG PO CAPS: 200.0000 mg | ORAL_CAPSULE | Freq: Every day | ORAL | 1 refills | Status: DC

## 2022-09-24 NOTE — Telephone Encounter (Signed)
Pt called and states his gabapentin was not at the pharmacy and wanted to have it sent to pharmacy

## 2022-12-29 ENCOUNTER — Ambulatory Visit (INDEPENDENT_AMBULATORY_CARE_PROVIDER_SITE_OTHER): Payer: Self-pay | Admitting: Medical

## 2022-12-29 ENCOUNTER — Encounter: Payer: Self-pay | Admitting: Medical

## 2022-12-29 VITALS — BP 130/86 | HR 97 | Temp 97.9°F | Wt 136.0 lb

## 2022-12-29 DIAGNOSIS — G510 Bell's palsy: Secondary | ICD-10-CM

## 2022-12-29 DIAGNOSIS — R52 Pain, unspecified: Secondary | ICD-10-CM

## 2022-12-29 LAB — POC COVID19 BINAXNOW: SARS Coronavirus 2 Ag: NEGATIVE

## 2022-12-29 LAB — POCT INFLUENZA A/B
Influenza A, POC: NEGATIVE
Influenza B, POC: NEGATIVE

## 2022-12-29 MED ORDER — PREDNISONE 10 MG PO TABS: ORAL_TABLET | ORAL | 0 refills | Status: DC

## 2022-12-29 MED ORDER — THERATEARS 0.25 % OP SOLN: 2.0000 [drp] | OPHTHALMIC | 2 refills | Status: DC

## 2022-12-29 NOTE — Patient Instructions (Signed)
Li?t n?a m?t, Ng??i l?n [place], Adult  Li?t n?a m?t l tnh tr?ng khng th? c? ??ng c? ? m?t ph?n m?t trong th?i gian ng?n. Tnh tr?ng khng th? c? ??ng, hay cn g?i l li?t, x?y ra do vim ho?c chn p dy th?n kinh s? s? 7. Dy th?n kinh ny ch?y d?c theo h?p s? v d??i tai ??n ph?n bn c?a m?t. Dy th?n kinh ny ch?u trch nhi?m cho cc c? ??ng ? m?t nh? ch?p m?t, nh?m m?t, c??i v cau my. Nguyn nhn g gy ra? Khng r nguyn nhn chnh xc gy ra tnh tr?ng ny. Tnh tr?ng ny c th? do nhi?m vi rt, ch?ng h?n nh? th?y ??u (herpes zoster), Epstein-Barr, ho?c vi rt quai b? gy ra. ?i?u g lm t?ng nguy c?? Qu v? d? b? tnh tr?ng ny h?n n?u: Qu v? ?ang mang [place]. Qu v? b? ti?u ???ng. G?n ?y qu v? b? nhi?m trng ? m?i, h?ng, ho?c ???ng th?Sander Nephew v? c h? th?ng b?o v? c? th? (h? mi?n d?ch) suy y?u. Qu v? b? ch?n th??ng m?t, ch?ng h?n nh? gy x??ng. Qu v? c ti?n s? gia ?nh b? li?t n?a m?t. Cc d?u hi?u ho?c tri?u ch?ng l g? Nh?ng tri?u ch?ng c?a tnh tr?ng ny bao g?m: Y?u ? m?t bn m?t. Mi m?t v khe mi?ng x? xu?ng. Ch?y qu nhi?u n??c m?t ? m?t bn m?t. Kh nh?m mi m?t. M?t kh. Ch?y n??c di. Mi?ng kh. Thay ??i v? gic. Thay ??i b? ngoi khun m?t. ?au pha sau m?t tai. Ti?ng  ? m?t ho?c c? hai tai. Nh?y c?m m thanh ? m?t bn tai. Co gi?t trn m?t. ?au ??u. Kh? n?ng ni b? suy y?u. Chng m?t. Kh ?n ho?c kh u?ng. Ch? m?t bn m?t b? ?nh h??ng trong h?u h?t th?i gian. Trong tr??ng h?p hi?m g?p, li?t n?a m?t c th? ?nh h??ng ??n ton b? khun m?t. Ch?n ?on tnh tr?ng ny nh? th? no? Tnh tr?ng ny ???c ch?n ?on d?a vo: Tri?u ch?ng c?a qu v?. B?nh s? c?a qu v?. Khm th?c th?Sander Nephew v? c?ng c th? ph?i g?p chuyn gia ch?m [place] s?c kh?e chuyn v? cc b?nh l c?a dy th?n kinh (bc s? th?n kinh) ho?c v? cc b?nh v tnh tr?ng b?nh l ? m?t (bc s? nhn khoa). Qu v? c th? ph?i lm cc ki?m tra, ch?ng h?n nh?: Ki?m tra th??ng t?n dy th?n kinh (?i?n c?  ??). Cc nghin c?u hnh ?nh, ch?ng h?n nh? ch?p CT (ch?p c?t l?p) ho?c MRI (ch?p c?ng h??ng t?). Xt nghi?m mu. Tnh tr?ng ny ???c ?i?u tr? nh? th? no? Tnh tr?ng ny ?nh h??ng ??n m?i ng??i theo cch th?c khc nhau. ?i khi, cc tri?u ch?ng t? h?t m khng c?n ?i?u tr? trong m?t vi tu?n. N?u c?n ?i?u tr?, vi?c ?i?u tr? ? m?i ng??i l khc nhau. M?c tiu c?a ?i?u tr? l gi?m vim v b?o v? m?t kh?i b? th??ng t?n. ?i?u tr? li?t n?a m?t c th? bao g?m: Thu?c, ch?ng h?n nh?: Steroid ?? gi?m s?ng v gi?m vim. Thu?c khng vi rt. Thu?c gi?m ?au, ch?ng h?n nh? aspirin, acetaminophen, hay ibuprofen. Thu?c m? ho?c thu?c nh? m?t ?? gi? ?m m?t. B?o v? m?t, n?u qu v? khng th? nh?m m?t. T?p luy?n ho?c mt-xa ?? ph?c h?i ch?c n?ng v s?c m?nh c?a c? (v?t l tr? li?u). Tun th? nh?ng h??ng d?n ny ? nh:  Ch? s? d?ng thu?c  khng k ??n v thu?c k ??n theo ch? d?n c?a chuyn gia ch?m [place] s?c kh?e. N?u m?t qu v? b? ?nh h??ng: Nh? thu?c nh? m?t ho?c tra thu?c m? theo ch? d?n c?a chuyn gia ch?m [place] s?c kh?e c?a qu v? ?? gi? ?m cho m?t. Tun theo ch? d?n c?a chuyn gia ch?m [place] s?c kh?e c?a qu v? ?? ch?m [place] v b?o v? m?t. T?p b?t k? bi t?p v?t l tr? li?u no theo ch? d?n c?a chuyn gia ch?m [place] s?c kh?e c?a qu v?. Tun th? theo t?t c? cc l?n khm l?i. ?i?u ny c vai tr quan tr?ng. Hy lin l?c v?i chuyn gia ch?m [place] s?c kh?e n?u: Qu v? b? s?t ho?c ?n l?nh. Cc tri?u ch?ng c?a qu v? khng c?i thi?n trong vng 2-3 tu?n, ho?c cc tri?u ch?ng c?a qu v? tr?m tr?ng h?n. M?t qu v? ??, b? kch ?ng, ho?c ?au. Qu v? c cc tri?u ch?ng m?i. Yu c?u tr? gip ngay l?p t?c n?u: Ngoi m?t ra, qu v? b? t ho?c y?u ? m?t ph?n c? th?. Qu v? b? kh nu?t. Qu v? b? ?au ho?c c?ng c?. Qu v? b? chng m?t ho?c kh th?. Tm t?t Li?t n?a m?t l tnh tr?ng khng th? c? ??ng c? ? m?t ph?n m?t trong th?i gian ng?n. Tnh tr?ng khng th? c? ??ng ny l do vim ho?c chn p dy th?n kinh m?t. Tnh tr?ng  ny ?nh h??ng ??n m?i ng??i theo cch th?c khc nhau. ?i khi, cc tri?u ch?ng t? h?t m khng c?n ?i?u tr? trong m?t vi tu?n. N?u c?n ?i?u tr?, vi?c ?i?u tr? ? m?i ng??i l khc nhau. M?c tiu c?a ?i?u tr? l gi?m vim v b?o v? m?t kh?i b? th??ng t?n. Lin l?c v?i chuyn gia ch?m [place] s?c kh?e n?u cc tri?u ch?ng c?a qu v? khng ?? trong vng 2-3 tu?n, ho?c cc tri?u ch?ng c?a qu v? tr?m tr?ng h?n. Thng tin ny khng nh?m m?c ?ch thay th? cho l?i khuyn m chuyn gia ch?m [place] s?c kh?e ni v?i qu v?. Hy b?o ??m qu v? ph?i th?o lu?n b?t k? v?n ?? g m qu v? c v?i chuyn gia ch?m [place] s?c kh?e c?a qu v?. Document Revised: 10/08/2020 Document Reviewed: 10/08/2020 Elsevier Patient Education  [place].

## 2022-12-29 NOTE — Progress Notes (Signed)
Subjective:  David Shepherd is a 57 y.o. male who presents for Chief Complaint  Patient presents with   other    Face is swelling, and dry mouth, slept all day Saturday until Sunday, lt. Eye watery, no fever, head ache a little bit,      Here for new concerns.  Initially on triage there was a concern about body aches have some testing was done for screening before he entered the building.  He is here with interpreter from home health  He reports this past Saturday 4 days ago he woke up feeling his face swollen and felt weak on the left side of his face, some watery eyes.  Face felt somewhat hot at times.  No other symptoms.  No cough, runny nose, sneezing, fever, no nausea vomiting or diarrhea, no numbness or tingling or other new symptoms.  Feels fine other than weakness in his left face, drooling and, somewhat hard to chew food.  No other aggravating or relieving factors.    No other c/o.  Past Medical History:  Diagnosis Date   Hyperlipidemia    Hypertension    Tension headache    Tobacco use    Current Outpatient Medications on File Prior to Visit  Medication Sig Dispense Refill   amLODipine (NORVASC) 5 MG tablet Take 1 tablet (5 mg total) by mouth daily. 90 tablet 3   atorvastatin (LIPITOR) 20 MG tablet Take 1 tablet (20 mg total) by mouth daily. 90 tablet 3   meclizine (ANTIVERT) 25 MG tablet Take 1 tablet (25 mg total) by mouth 2 (two) times daily as needed for dizziness. 60 tablet 2   gabapentin (NEURONTIN) 100 MG capsule Take 2 capsules (200 mg total) by mouth at bedtime. 60 capsule 1   meclizine (ANTIVERT) 25 MG tablet TAKE 1 TABLET(25 MG) BY MOUTH DAILY (Patient not taking: Reported on 12/29/2022) 30 tablet 0   No current facility-administered medications on file prior to visit.    The following portions of the patient's history were reviewed and updated as appropriate: allergies, current medications, past family history, past medical history, past social history, past surgical  history and problem list.  ROS Otherwise as in subjective above  Objective: BP 130/86   Pulse 97   Temp 97.9 F (36.6 C)   Wt 136 lb (61.7 kg)   BMI 25.70 kg/m   General appearance: alert, no distress, well developed, well nourished HEENT: normocephalic, sclerae anicteric, conjunctiva pink and moist, TMs pearly, nares patent, no discharge or erythema, pharynx normal Oral cavity: MMM, no lesions Neck: supple, no lymphadenopathy, no thyromegaly, no masses, no bruits Heart: RRR, normal S1, S2, no murmurs Lungs: CTA bilaterally, no wheezes, rhonchi, or rales Pulses: 2+ radial pulses, 2+ pedal pulses, normal cap refill Ext: no edema Neuro: Weakness of left face throughout but sensation seems normal, no obvious deformity or swelling otherwise.  He has some ability to close the but weak on the left compared to right. Rest of neuroexam nonfocal    Assessment: Encounter Diagnoses  Name Primary?   Bell's palsy Yes   Body aches      Plan: We discussed the findings and diagnosis of Bell's palsy.  We discussed possible causes, possible treatments, possible complications.  Reassured that this was not a stroke.  Begin medications below.  Discussed importance of lubrication to the eye and wearing an eye patch and closing the eyelids at night to avoid ulceration or complication of the eye.  Hydrate well.  I gave the  option of referral to physical therapy for facial muscle exercise but he declines at this time wants to try the medication first.  I recommended follow-up in 1 to 2 weeks.  If not seeing improvements at that time we will recommend physical therapy and possible referral to specialist.  If any changes in his in the next week then recheck right away.  David Shepherd was seen today for other.  Diagnoses and all orders for this visit:  Bell's palsy  Body aches -     POCT Influenza A/B -     POC COVID-19  Other orders -     predniSONE (DELTASONE) 10 MG tablet; 60mg  or 6 tablets daily  for 5 days, then 5 tablets day 6, 4 tablets day 7, 3 tablets day 8, 2 tablets day 9, 1 tablet day 10 -     Carboxymethylcellulose Sodium (THERATEARS) 0.25 % SOLN; Apply 2 drops to eye every 4 (four) hours.   Spent > 45 minutes face to face with patient in discussion of symptoms, evaluation, plan and recommendations.    Follow up: 1-2 wk

## 2023-01-05 ENCOUNTER — Ambulatory Visit: Payer: PRIVATE HEALTH INSURANCE | Admitting: Medical

## 2023-01-07 NOTE — Progress Notes (Deleted)
Cardiology Office Note:    Date:  01/07/2023   ID:  David Shepherd, DOB September 10, 1966, MRN [id number]  PCP:  Carlena Hurl, PA-C   [facility] Providers Cardiologist:  Early Osmond, MD { Click to update primary MD,subspecialty MD or APP then REFRESH:1}    Referring MD: Carlena Hurl, PA-C   Chief Complaint: ***  History of Present Illness:    David Shepherd is a *** 57 y.o. male with a hx of hyperlipidemia, HTN, tobacco use,   Referred to cardiology for evaluation of dyspnea and seen by Dr. Ali Lowe on 07/02/22. Reported fatigue with walking long distances or walking up an incline. Had EKG done by PCP which was reassuring. CXR was also ordered but had not yet been completed. Laboratory tests were unremarkable. Reported fatigue for approximately last 6 months. Also had developed DOE. Has chest pain that occurs at rest and with exertion. Rest improves his symptoms. Reported orthopnea but no edema, PND. Gets lightheaded when going from sitting to standing quickly. Currently on disability after sustaining an injury to left shoulder from forklift accident.   He underwent CCTA 09/16/22 which revealed calcium score of 0, no CAD. Echo revealed LVEF 60-65%, no rwma, normal diastolic parameters, no significant valvular issues, mild dilatation of ascending aorta at 40 mm. TSH was normal. He was advised to return in 6 months for follow-up.   Today, he is here     Past Medical History:  Diagnosis Date   Hyperlipidemia    Hypertension    Tension headache    Tobacco use     Past Surgical History:  Procedure Laterality Date   NO PAST SURGERIES  11/2018    Current Medications: No outpatient medications have been marked as taking for the 01/10/23 encounter (Appointment) with Ann Maki, Lanice Schwab, NP.     Allergies:   Amlodipine and Hctz [hydrochlorothiazide]   Social History   Socioeconomic History   Marital status: Married    Spouse name: Not on file   Number of children: Not on  file   Years of education: Not on file   Highest education level: Not on file  Occupational History   Not on file  Tobacco Use   Smoking status: Every Day    Packs/day: 1.00    Years: 35.00    Total pack years: 35.00    Types: Cigarettes   Smokeless tobacco: Never   Tobacco comments:    1-2 packs smoked daily 11/13/20 [facility]   Vaping Use   Vaping Use: Never used  Substance and Sexual Activity   Alcohol use: Not Currently    Comment: occasional   Drug use: Not Currently   Sexual activity: Not on file  Other Topics Concern   Not on file  Social History Narrative   Married.  [employer].  Exercises some.  11/2018.   Social Determinants of Health   Financial Resource Strain: Not on file  Food Insecurity: Not on file  Transportation Needs: Not on file  Physical Activity: Not on file  Stress: Not on file  Social Connections: Not on file     Family History: The patient's ***Family history is unknown by patient.  ROS:   Please see the history of present illness.    *** All other systems reviewed and are negative.  Labs/Other Studies Reviewed:    The following studies were reviewed today:  CCTA 09/16/22 1. Coronary calcium score of 0. This was 1st percentile for age, sex, and race matched  control.   2. CAD-RADS 0. No evidence of CAD (0%). Consider non-atherosclerotic causes of chest pain.  Echo 07/20/22 1. Left ventricular ejection fraction, by estimation, is 60 to 65%. Left  ventricular ejection fraction by 3D volume is 57 %. The left ventricle has  normal function. The left ventricle has no regional wall motion  abnormalities. Left ventricular diastolic   parameters were normal. The average left ventricular global longitudinal  strain is -21.0 %. The global longitudinal strain is normal.   2. Right ventricular systolic function is normal. The right ventricular  size is normal. Tricuspid regurgitation signal is inadequate for assessing  PA pressure.   3. The  mitral valve is normal in structure. No evidence of mitral valve  regurgitation. No evidence of mitral stenosis.   4. The aortic valve is tricuspid. Aortic valve regurgitation is not  visualized. No aortic stenosis is present.   5. Aortic dilatation noted. There is mild dilatation of the ascending  aorta, measuring 40 mm.   6. The inferior vena cava is normal in size with greater than 50%  respiratory variability, suggesting right atrial pressure of 3 mmHg.   Comparison(s): No prior Echocardiogram.    Recent Labs: 06/24/2022: BUN 9; Creatinine, Ser 0.85; Potassium 3.8; Sodium 139 07/02/2022: TSH 1.840 09/17/2022: ALT 14; Hemoglobin 15.2; Platelets 190  Recent Lipid Panel    Component Value Date/Time   CHOL 108 09/17/2022 1143   TRIG 137 09/17/2022 1143   HDL 35 (L) 09/17/2022 1143   CHOLHDL 3.1 09/17/2022 1143   LDLCALC 49 09/17/2022 1143     Risk Assessment/Calculations:   {Does this patient have ATRIAL FIBRILLATION?:[id number]}       Physical Exam:    VS:  There were no vitals taken for this visit.    Wt Readings from Last 3 Encounters:  12/29/22 136 lb (61.7 kg)  09/17/22 133 lb 9.6 oz (60.6 kg)  07/02/22 130 lb 9.6 oz (59.2 kg)     GEN: *** Well nourished, well developed in no acute distress HEENT: Normal NECK: No JVD; No carotid bruits CARDIAC: ***RRR, no murmurs, rubs, gallops RESPIRATORY:  Clear to auscultation without rales, wheezing or rhonchi  ABDOMEN: Soft, non-tender, non-distended MUSCULOSKELETAL:  No edema; No deformity. *** pedal pulses, ***bilaterally SKIN: Warm and dry NEUROLOGIC:  Alert and oriented x 3 PSYCHIATRIC:  Normal affect   EKG:  EKG is *** ordered today.  The ekg ordered today demonstrates ***  No BP recorded.  {Refresh Note OR Click here to enter BP  :1}***    Diagnoses:    No diagnosis found. Assessment and Plan:     Chest pain: CCTA 08/2022 with no evidence of CAD Hyperlipidemia LDL goal < 100: LDL 09/17/22 = 49.  {Are you  ordering a CV Procedure (e.g. stress test, cath, DCCV, TEE, etc)?   Press F2        :[id number]   Disposition:  Medication Adjustments/Labs and Tests Ordered: Current medicines are reviewed at length with the patient today.  Concerns regarding medicines are outlined above.  No orders of the defined types were placed in this encounter.  No orders of the defined types were placed in this encounter.   There are no Patient Instructions on file for this visit.   Signed, Emmaline Life, NP  01/07/2023 7:49 AM    [place]

## 2023-01-10 ENCOUNTER — Ambulatory Visit: Payer: Commercial Managed Care - HMO | Admitting: Nurse Practitioner

## 2023-03-14 ENCOUNTER — Other Ambulatory Visit: Payer: Self-pay | Admitting: Medical

## 2023-04-01 ENCOUNTER — Other Ambulatory Visit: Payer: Self-pay | Admitting: Medical

## 2023-09-20 ENCOUNTER — Encounter: Payer: Commercial Managed Care - HMO | Admitting: Medical

## 2023-10-25 ENCOUNTER — Other Ambulatory Visit: Payer: Self-pay | Admitting: Medical

## 2023-10-25 NOTE — Telephone Encounter (Signed)
I have sent message up front for pt to get scheduled

## 2023-10-25 NOTE — Telephone Encounter (Signed)
Pt was called for an appt.

## 2023-12-22 ENCOUNTER — Telehealth: Payer: Self-pay | Admitting: Internal Medicine

## 2023-12-22 ENCOUNTER — Other Ambulatory Visit: Payer: Self-pay | Admitting: Medical

## 2023-12-22 NOTE — Telephone Encounter (Signed)
Will have the front office call him for an appt

## 2023-12-22 NOTE — Telephone Encounter (Signed)
Spoke to pt, we don't take his insurance but he is planning to come back next year when he has new insurance. Advised pt to have pharmacy to contact his current doctor for refills

## 2023-12-22 NOTE — Telephone Encounter (Signed)
Pt is overdue for a visit. Last visit 08/2022. Please schedule him a visit.

## 2024-01-26 ENCOUNTER — Ambulatory Visit: Payer: Medicaid Other | Admitting: Medical

## 2024-01-26 VITALS — BP 122/76 | HR 73 | Wt 124.2 lb

## 2024-01-26 DIAGNOSIS — E782 Mixed hyperlipidemia: Secondary | ICD-10-CM | POA: Diagnosis not present

## 2024-01-26 DIAGNOSIS — Z720 Tobacco use: Secondary | ICD-10-CM

## 2024-01-26 DIAGNOSIS — Z0000 Encounter for general adult medical examination without abnormal findings: Secondary | ICD-10-CM

## 2024-01-26 DIAGNOSIS — H9190 Unspecified hearing loss, unspecified ear: Secondary | ICD-10-CM | POA: Insufficient documentation

## 2024-01-26 DIAGNOSIS — H900 Conductive hearing loss, bilateral: Secondary | ICD-10-CM

## 2024-01-26 DIAGNOSIS — Z1211 Encounter for screening for malignant neoplasm of colon: Secondary | ICD-10-CM | POA: Diagnosis not present

## 2024-01-26 DIAGNOSIS — H9113 Presbycusis, bilateral: Secondary | ICD-10-CM

## 2024-01-26 DIAGNOSIS — Z125 Encounter for screening for malignant neoplasm of prostate: Secondary | ICD-10-CM

## 2024-01-26 DIAGNOSIS — I10 Essential (primary) hypertension: Secondary | ICD-10-CM

## 2024-01-26 DIAGNOSIS — Z122 Encounter for screening for malignant neoplasm of respiratory organs: Secondary | ICD-10-CM | POA: Insufficient documentation

## 2024-01-26 MED ORDER — AMLODIPINE BESYLATE 5 MG PO TABS: 5.0000 mg | ORAL_TABLET | Freq: Every day | ORAL | 2 refills | Status: DC

## 2024-01-26 MED ORDER — GABAPENTIN 100 MG PO CAPS: 200.0000 mg | ORAL_CAPSULE | Freq: Every day | ORAL | 2 refills | Status: DC

## 2024-01-26 MED ORDER — ATORVASTATIN CALCIUM 20 MG PO TABS: 20.0000 mg | ORAL_TABLET | Freq: Every day | ORAL | 2 refills | Status: DC

## 2024-01-26 NOTE — Assessment & Plan Note (Signed)
Referral back to audiology

## 2024-01-26 NOTE — Progress Notes (Signed)
Complete physical exam  Patient: David Shepherd   DOB: [birth year]   58 y.o. Male  MRN: [id number]  Subjective:    Chief Complaint  Patient presents with   Medical Management of Chronic Issues    Has not been in sent 08/2022 and needs refills on medications    David Shepherd is a 58 y.o. male who presents today for a complete physical exam.  Here for med check.  Here with interpreter from [facility].  In the last year he got on disability due to ongoing radicular issues and neck issues.  He is compliant with blood pressure medicine.  Home blood pressures usually run normal.  No chest pain or shortness of breath or palpitations.  Hyperlipidemia-compliant with Lipitor without complaint  He still uses gabapentin  He has ongoing hearing loss and would like to get rechecked on this.  He saw ENT a few years ago  He notes having COVID back in July 2024, had a pretty bad illness and did not eat well for a whole week as he had no appetite.  His weight has been the same since then as he lost some weight at that time  He still smokes some.  No other aggravating or relieving factors.    No other c/o.   Most recent fall risk assessment:    01/26/2024   11:32 AM  Fall Risk   Falls in the past year? 0  Number falls in past yr: 0  Injury with Fall? 0  Risk for fall due to : No Fall Risks  Follow up Falls evaluation completed     Most recent depression screenings:    01/26/2024   11:32 AM 09/17/2022   10:57 AM  PHQ 2/9 Scores  PHQ - 2 Score 0 0    Patient Active Problem List   Diagnosis Date Noted   Screening for colon cancer 01/26/2024   Hearing loss 01/26/2024   Screening for lung cancer 01/26/2024   Chest discomfort 09/17/2022   Other fatigue 06/24/2022   Thyroid nodule 12/24/2021   Elevated red blood cell count 12/24/2021   Encounter for health maintenance examination in adult 09/15/2021   Screen for colon cancer 09/15/2021   Chronic nonintractable headache 09/15/2021    Paresthesia of both lower extremities 09/15/2021   Mixed dyslipidemia 06/12/2020   Low mean corpuscular volume (MCV) 06/12/2020   Night sweat 06/12/2020   Left arm pain 05/15/2020   Cervical radiculopathy 05/15/2020   Numbness and tingling in left arm 05/15/2020   Essential hypertension, benign 05/15/2020   Conductive hearing loss, bilateral 01/10/2019   Presbycusis of both ears 01/10/2019   Screening for prostate cancer 12/22/2018   Tobacco use 12/22/2018   Tinnitus of both ears 12/22/2018   Past Medical History:  Diagnosis Date   Hyperlipidemia    Hypertension    Tension headache    Tobacco use    Past Surgical History:  Procedure Laterality Date   NO PAST SURGERIES  11/2018   Social History   Tobacco Use   Smoking status: Every Day    Current packs/day: 1.00    Average packs/day: 1 pack/day for 35.0 years (35.0 ttl pk-yrs)    Types: Cigarettes   Smokeless tobacco: Never   Tobacco comments:    1-2 packs smoked daily 11/13/20 [facility]   Vaping Use   Vaping status: Never Used  Substance Use Topics   Alcohol use: Not Currently    Comment: occasional   Drug use: Not Currently  Family History  Family history unknown: Yes   Allergies  Allergen Reactions   Amlodipine Palpitations    At higher dose   Hctz [Hydrochlorothiazide] Other (See Comments)    Headache       Patient Care Team: Julie-Anne Torain, Kermit Balo, PA-C as PCP - General (Family Medicine) Orbie Pyo, MD as PCP - Cardiology (Cardiology) Malachy Mood, MD as Consulting Physician (Hematology)   Outpatient Medications Prior to Visit  Medication Sig   [DISCONTINUED] amLODipine (NORVASC) 5 MG tablet TAKE 1 TABLET(5 MG) BY MOUTH DAILY   [DISCONTINUED] atorvastatin (LIPITOR) 20 MG tablet TAKE 1 TABLET(20 MG) BY MOUTH DAILY   [DISCONTINUED] gabapentin (NEURONTIN) 100 MG capsule TAKE 2 CAPSULES(200 MG) BY MOUTH AT BEDTIME   [DISCONTINUED] Carboxymethylcellulose Sodium (THERATEARS) 0.25 % SOLN Apply 2 drops to eye  every 4 (four) hours.   [DISCONTINUED] meclizine (ANTIVERT) 25 MG tablet TAKE 1 TABLET(25 MG) BY MOUTH DAILY (Patient not taking: Reported on 12/29/2022)   [DISCONTINUED] meclizine (ANTIVERT) 25 MG tablet Take 1 tablet (25 mg total) by mouth 2 (two) times daily as needed for dizziness.   [DISCONTINUED] predniSONE (DELTASONE) 10 MG tablet 60mg  or 6 tablets daily for 5 days, then 5 tablets day 6, 4 tablets day 7, 3 tablets day 8, 2 tablets day 9, 1 tablet day 10   No facility-administered medications prior to visit.    Review of Systems  Constitutional:  Negative for chills, fever, malaise/fatigue and weight loss.  HENT:  Positive for hearing loss. Negative for congestion, ear pain, sore throat and tinnitus.   Eyes:  Negative for blurred vision, pain and redness.  Respiratory:  Negative for cough, hemoptysis and shortness of breath.   Cardiovascular:  Negative for chest pain, palpitations, orthopnea, claudication and leg swelling.  Gastrointestinal:  Negative for abdominal pain, blood in stool, constipation, diarrhea, nausea and vomiting.  Genitourinary:  Negative for dysuria, flank pain, frequency, hematuria and urgency.  Musculoskeletal:  Negative for falls, joint pain and myalgias.  Skin:  Negative for itching and rash.  Neurological:  Negative for dizziness, tingling, speech change, weakness and headaches.  Endo/Heme/Allergies:  Negative for polydipsia. Does not bruise/bleed easily.  Psychiatric/Behavioral:  Negative for depression and memory loss. The patient is not nervous/anxious and does not have insomnia.       Objective:     BP 122/76   Pulse 73   Wt 124 lb 3.2 oz (56.3 kg)   SpO2 99%   BMI 23.47 kg/m  BP Readings from Last 3 Encounters:  01/26/24 122/76  12/29/22 130/86  09/17/22 122/82   Wt Readings from Last 3 Encounters:  01/26/24 124 lb 3.2 oz (56.3 kg)  12/29/22 136 lb (61.7 kg)  09/17/22 133 lb 9.6 oz (60.6 kg)     Physical Exam Vitals and nursing note  reviewed.  Constitutional:      General: He is not in acute distress.    Appearance: Normal appearance. He is not ill-appearing.  HENT:     Head: Normocephalic and atraumatic.     Right Ear: External ear normal.     Left Ear: External ear normal.     Nose: Nose normal.     Mouth/Throat:     Mouth: Mucous membranes are moist.     Pharynx: Oropharynx is clear.  Eyes:     Extraocular Movements: Extraocular movements intact.     Conjunctiva/sclera: Conjunctivae normal.     Pupils: Pupils are equal, round, and reactive to light.  Neck:  Vascular: No carotid bruit.  Cardiovascular:     Rate and Rhythm: Normal rate and regular rhythm.     Pulses: Normal pulses.     Heart sounds: Normal heart sounds.  Pulmonary:     Effort: Pulmonary effort is normal.     Breath sounds: Normal breath sounds.  Abdominal:     General: Bowel sounds are normal. There is no distension.     Palpations: Abdomen is soft. There is no mass.     Tenderness: There is no abdominal tenderness.     Hernia: No hernia is present.  Genitourinary:    Comments: Deferred/declined Musculoskeletal:        General: No swelling, tenderness or deformity. Normal range of motion.     Cervical back: Normal range of motion and neck supple. No tenderness.     Right lower leg: No edema.     Left lower leg: No edema.  Lymphadenopathy:     Cervical: No cervical adenopathy.  Skin:    General: Skin is warm and dry.     Capillary Refill: Capillary refill takes less than 2 seconds.  Neurological:     General: No focal deficit present.     Mental Status: He is alert and oriented to person, place, and time. Mental status is at baseline.     Cranial Nerves: No cranial nerve deficit.     Sensory: No sensory deficit.     Motor: No weakness.     Gait: Gait normal.     Deep Tendon Reflexes: Reflexes normal.  Psychiatric:        Mood and Affect: Mood normal.        Behavior: Behavior normal.        Judgment: Judgment normal.           Assessment & Plan:    Routine Health Maintenance and Physical Exam  Immunization History  Administered Date(s) Administered   Influenza,inj,Quad PF,6+ Mos 03/07/2019, 09/17/2022   PFIZER(Purple Top)SARS-COV-2 Vaccination 07/05/2020, 07/26/2020   Pneumococcal Polysaccharide-23 09/17/2022   Tdap 03/07/2019    Health Maintenance  Topic Date Due   Lung Cancer Screening  09/16/2023   COVID-19 Vaccine (3 - 2024-25 season) 02/11/2024 (Originally 08/28/2023)   INFLUENZA VACCINE  03/26/2024 (Originally 07/28/2023)   Zoster Vaccines- Shingrix (1 of 2) 04/25/2024 (Originally 03/25/2016)   Pneumococcal Vaccine 38-60 Years old (2 of 2 - PCV) 01/25/2025 (Originally 09/18/2023)   Fecal DNA (Cologuard)  09/21/2024   DTaP/Tdap/Td (2 - Td or Tdap) 03/06/2029   Hepatitis C Screening  Completed   HIV Screening  Completed   HPV VACCINES  Aged Out    Discussed health benefits of physical activity, and encouraged him to engage in regular exercise appropriate for his age and condition.     Problem List Items Addressed This Visit     Screening for prostate cancer   Counseled on prostate cancer screening.  Updated labs today      Relevant Orders   PSA   Tobacco use   I recommended complete cessation      Relevant Orders   CT CHEST LUNG CA SCREEN LOW DOSE W/O CM   Conductive hearing loss, bilateral   Referral back to audiology      Presbycusis of both ears   Referral back to audiology      Essential hypertension, benign - Primary   Controlled, continue amlodipine      Relevant Medications   amLODipine (NORVASC) 5 MG tablet   atorvastatin (LIPITOR) 20 MG  tablet   Other Relevant Orders   Comprehensive metabolic panel   Mixed dyslipidemia   Continue current statin      Relevant Medications   atorvastatin (LIPITOR) 20 MG tablet   Other Relevant Orders   Lipid panel   Encounter for health maintenance examination in adult   Relevant Orders   Comprehensive metabolic panel    Lipid panel   TSH   PSA   CBC with Differential/Platelet   Screen for colon cancer   Screening for colon cancer   He will be due for repeat Cologuard in September 2025      Hearing loss   Screening for lung cancer   Relevant Orders   CT CHEST LUNG CA SCREEN LOW DOSE W/O CM   Rainen was seen today for medical management of chronic issues.  Diagnoses and all orders for this visit:  Essential hypertension, benign -     Comprehensive metabolic panel  Mixed dyslipidemia -     Lipid panel  Screening for colon cancer  Tobacco use -     CT CHEST LUNG CA SCREEN LOW DOSE W/O CM; Future  Encounter for health maintenance examination in adult -     Comprehensive metabolic panel -     Lipid panel -     TSH -     PSA -     CBC with Differential/Platelet  Screening for prostate cancer -     PSA  Screen for colon cancer  Hearing loss, unspecified hearing loss type, unspecified laterality  Screening for lung cancer -     CT CHEST LUNG CA SCREEN LOW DOSE W/O CM; Future  Conductive hearing loss, bilateral  Presbycusis of both ears  Other orders -     amLODipine (NORVASC) 5 MG tablet; Take 1 tablet (5 mg total) by mouth daily. -     atorvastatin (LIPITOR) 20 MG tablet; Take 1 tablet (20 mg total) by mouth daily. -     gabapentin (NEURONTIN) 100 MG capsule; Take 2 capsules (200 mg total) by mouth at bedtime.    Return for CPX physical fasting.     Kristian Covey, PA-C

## 2024-01-26 NOTE — Assessment & Plan Note (Signed)
I recommended complete cessation

## 2024-01-26 NOTE — Assessment & Plan Note (Signed)
Controlled, continue amlodipine

## 2024-01-26 NOTE — Assessment & Plan Note (Signed)
Counseled on prostate cancer screening.  Updated labs today

## 2024-01-26 NOTE — Assessment & Plan Note (Signed)
Continue current statin

## 2024-01-26 NOTE — Assessment & Plan Note (Signed)
He will be due for repeat Cologuard in September 2025

## 2024-01-27 ENCOUNTER — Other Ambulatory Visit: Payer: Self-pay | Admitting: Internal Medicine

## 2024-01-27 ENCOUNTER — Other Ambulatory Visit: Payer: Self-pay | Admitting: Medical

## 2024-01-27 ENCOUNTER — Telehealth: Payer: Self-pay | Admitting: Medical

## 2024-01-27 DIAGNOSIS — H9113 Presbycusis, bilateral: Secondary | ICD-10-CM

## 2024-01-27 DIAGNOSIS — H9190 Unspecified hearing loss, unspecified ear: Secondary | ICD-10-CM

## 2024-01-27 DIAGNOSIS — Z122 Encounter for screening for malignant neoplasm of respiratory organs: Secondary | ICD-10-CM

## 2024-01-27 DIAGNOSIS — Z720 Tobacco use: Secondary | ICD-10-CM

## 2024-01-27 DIAGNOSIS — H900 Conductive hearing loss, bilateral: Secondary | ICD-10-CM

## 2024-01-27 LAB — TSH: TSH: 1.37 u[IU]/mL (ref 0.450–4.500)

## 2024-01-27 LAB — COMPREHENSIVE METABOLIC PANEL
ALT: 8 [IU]/L (ref 0–44)
AST: 17 [IU]/L (ref 0–40)
Albumin: 4.3 g/dL (ref 3.8–4.9)
Alkaline Phosphatase: 67 [IU]/L (ref 44–121)
BUN/Creatinine Ratio: 11 (ref 9–20)
BUN: 10 mg/dL (ref 6–24)
Bilirubin Total: 0.5 mg/dL (ref 0.0–1.2)
CO2: 21 mmol/L (ref 20–29)
Calcium: 9.5 mg/dL (ref 8.7–10.2)
Chloride: 101 mmol/L (ref 96–106)
Creatinine, Ser: 0.89 mg/dL (ref 0.76–1.27)
Globulin, Total: 3 g/dL (ref 1.5–4.5)
Glucose: 70 mg/dL (ref 70–99)
Potassium: 3.9 mmol/L (ref 3.5–5.2)
Sodium: 137 mmol/L (ref 134–144)
Total Protein: 7.3 g/dL (ref 6.0–8.5)
eGFR: 100 mL/min/{1.73_m2} (ref >59)

## 2024-01-27 LAB — LIPID PANEL
Chol/HDL Ratio: 2.4 {ratio} (ref 0.0–5.0)
Cholesterol, Total: 99 mg/dL — ABNORMAL LOW (ref 100–199)
HDL: 41 mg/dL (ref >39)
LDL Chol Calc (NIH): 41 mg/dL (ref 0–99)
Triglycerides: 82 mg/dL (ref 0–149)
VLDL Cholesterol Cal: 17 mg/dL (ref 5–40)

## 2024-01-27 LAB — CBC WITH DIFFERENTIAL/PLATELET
Basophils Absolute: 0.1 10*3/uL (ref 0.0–0.2)
Basos: 1 %
EOS (ABSOLUTE): 0.2 10*3/uL (ref 0.0–0.4)
Eos: 2 %
Hematocrit: 51 % (ref 37.5–51.0)
Hemoglobin: 15.3 g/dL (ref 13.0–17.7)
Immature Grans (Abs): 0 10*3/uL (ref 0.0–0.1)
Immature Granulocytes: 0 %
Lymphocytes Absolute: 3 10*3/uL (ref 0.7–3.1)
Lymphs: 34 %
MCH: 22.5 pg — ABNORMAL LOW (ref 26.6–33.0)
MCHC: 30 g/dL — ABNORMAL LOW (ref 31.5–35.7)
MCV: 75 fL — ABNORMAL LOW (ref 79–97)
Monocytes Absolute: 0.7 10*3/uL (ref 0.1–0.9)
Monocytes: 9 %
Neutrophils Absolute: 4.7 10*3/uL (ref 1.4–7.0)
Neutrophils: 54 %
Platelets: 225 10*3/uL (ref 150–450)
RBC: 6.81 x10E6/uL — ABNORMAL HIGH (ref 4.14–5.80)
RDW: 14.4 % (ref 11.6–15.4)
WBC: 8.7 10*3/uL (ref 3.4–10.8)

## 2024-01-27 LAB — PSA: Prostate Specific Ag, Serum: 1.5 ng/mL (ref 0.0–4.0)

## 2024-01-27 NOTE — Progress Notes (Signed)
Labs show cholesterol looks pretty good, blood counts are stable liver kidney and electrolytes normal, thyroid normal, prostate normal  Continue current medications  Expect a phone call back about doing a CT chest scan lung cancer screening, and expect referral back to ear nose and throat  Sabrina-please send back referral to the audiologist he has seen before a few years ago for hearing loss

## 2024-01-27 NOTE — Telephone Encounter (Signed)
David Shepherd with [facility] called Per conversation with patient he does not qualify for cancer screening They would need new order for CT without

## 2024-01-27 NOTE — Telephone Encounter (Signed)
Per [facility] and their conversation with patient he was only a 16 pack year smoker and they have to be 20 pack year, so he would just need CT without contrast

## 2024-02-14 ENCOUNTER — Encounter: Payer: Self-pay | Admitting: Medical

## 2024-02-23 ENCOUNTER — Ambulatory Visit
Admission: RE | Admit: 2024-02-23 | Discharge: 2024-02-23 | Disposition: A | Discharge status: Home / Self Care | Payer: Medicaid Other | Source: Ambulatory Visit | Attending: Medical | Admitting: Medical

## 2024-02-23 DIAGNOSIS — Z122 Encounter for screening for malignant neoplasm of respiratory organs: Secondary | ICD-10-CM

## 2024-02-23 DIAGNOSIS — Z720 Tobacco use: Secondary | ICD-10-CM

## 2024-02-24 NOTE — Progress Notes (Signed)
 Thankfully your CT scan is normal without any obvious lung cancer

## 2024-02-28 ENCOUNTER — Ambulatory Visit: Payer: No Typology Code available for payment source | Admitting: Medical

## 2024-02-28 VITALS — BP 118/74 | HR 72 | Ht 60.0 in | Wt 123.4 lb

## 2024-02-28 DIAGNOSIS — E041 Nontoxic single thyroid nodule: Secondary | ICD-10-CM

## 2024-02-28 DIAGNOSIS — Z720 Tobacco use: Secondary | ICD-10-CM | POA: Diagnosis not present

## 2024-02-28 DIAGNOSIS — I10 Essential (primary) hypertension: Secondary | ICD-10-CM

## 2024-02-28 DIAGNOSIS — K029 Dental caries, unspecified: Secondary | ICD-10-CM

## 2024-02-28 DIAGNOSIS — Z1211 Encounter for screening for malignant neoplasm of colon: Secondary | ICD-10-CM

## 2024-02-28 DIAGNOSIS — R7989 Other specified abnormal findings of blood chemistry: Secondary | ICD-10-CM

## 2024-02-28 DIAGNOSIS — H900 Conductive hearing loss, bilateral: Secondary | ICD-10-CM

## 2024-02-28 DIAGNOSIS — H9113 Presbycusis, bilateral: Secondary | ICD-10-CM

## 2024-02-28 DIAGNOSIS — E782 Mixed hyperlipidemia: Secondary | ICD-10-CM

## 2024-02-28 DIAGNOSIS — Z0000 Encounter for general adult medical examination without abnormal findings: Secondary | ICD-10-CM

## 2024-02-28 DIAGNOSIS — R718 Other abnormality of red blood cells: Secondary | ICD-10-CM

## 2024-02-28 LAB — POCT URINALYSIS DIP (PROADVANTAGE DEVICE)
Bilirubin, UA: NEGATIVE
Blood, UA: NEGATIVE
Glucose, UA: NEGATIVE mg/dL
Ketones, POC UA: NEGATIVE mg/dL
Leukocytes, UA: NEGATIVE
Nitrite, UA: NEGATIVE
Protein Ur, POC: NEGATIVE mg/dL
Specific Gravity, Urine: 1.015
Urobilinogen, Ur: 0.2
pH, UA: 6 (ref 5.0–8.0)

## 2024-02-28 NOTE — Assessment & Plan Note (Signed)
 Follow-up with audiology soon as planned

## 2024-02-28 NOTE — Assessment & Plan Note (Signed)
 I reviewed back of his hematology notes from 2021.  They felt like his abnormal blood counts were in part due to ancestry being [place] Asian but also felt strongly his tobacco use contributed to the elevated red cells.  Updated labs today for surveillance.  I did encourage him to donate blood given the elevated red cells but he does not want to do that

## 2024-02-28 NOTE — Assessment & Plan Note (Signed)
 I encouraged him to quit smoking completely

## 2024-02-28 NOTE — Assessment & Plan Note (Signed)
 He will do the Cologuard repeat later this year in the fall

## 2024-02-28 NOTE — Progress Notes (Signed)
 Complete physical exam  Patient: David Shepherd   DOB: [birth year]   58 y.o. Male  MRN: [id number]  Subjective:    Chief Complaint  Patient presents with   Annual Exam    Fasting annual exam. No concerns. Patient is okay for flu shot if you suggest, declines covid booster today. Takes his 3 medications daily, did not take yet (due to fasting for labs).     David Shepherd is a 58 y.o. male who presents today for a complete physical exam. \  Here with intepreter Lek Siu.  He is scheduled to see audiology soon for recheck on hearing and ear issues.  He just did a CT chest lung cancer screening recently  He has no other complaints  He is compliant with medication   Most recent fall risk assessment:    01/26/2024   11:32 AM  Fall Risk   Falls in the past year? 0  Number falls in past yr: 0  Injury with Fall? 0  Risk for fall due to : No Fall Risks  Follow up Falls evaluation completed     Most recent depression screenings:    01/26/2024   11:32 AM 09/17/2022   10:57 AM  PHQ 2/9 Scores  PHQ - 2 Score 0 0     Patient Active Problem List   Diagnosis Date Noted   Elevated ferritin 02/28/2024   Screening for colon cancer 01/26/2024   Screening for lung cancer 01/26/2024   Thyroid nodule 12/24/2021   Elevated red blood cell count 12/24/2021   Encounter for health maintenance examination in adult 09/15/2021   Screen for colon cancer 09/15/2021   Mixed dyslipidemia 06/12/2020   Low mean corpuscular volume (MCV) 06/12/2020   Cervical radiculopathy 05/15/2020   Essential hypertension, benign 05/15/2020   Conductive hearing loss, bilateral 01/10/2019   Presbycusis of both ears 01/10/2019   Screening for prostate cancer 12/22/2018   Tobacco use 12/22/2018   Tinnitus of both ears 12/22/2018   Past Medical History:  Diagnosis Date   Hyperlipidemia    Hypertension    Tension headache    Tobacco use    Past Surgical History:  Procedure Laterality Date   NO PAST  SURGERIES  11/2018   Social History   Tobacco Use   Smoking status: Every Day    Current packs/day: 1.00    Average packs/day: 1 pack/day for 35.0 years (35.0 ttl pk-yrs)    Types: Cigarettes   Smokeless tobacco: Never   Tobacco comments:    1-2 packs smoked daily 11/13/20 [facility]   Vaping Use   Vaping status: Never Used  Substance Use Topics   Alcohol use: Not Currently    Comment: occasional   Drug use: Not Currently   Family History  Family history unknown: Yes   Allergies  Allergen Reactions   Amlodipine Palpitations    At higher dose   Hctz [Hydrochlorothiazide] Other (See Comments)    Headache       Patient Care Team: Eyan Hagood, Kermit Balo, PA-C as PCP - General (Family Medicine) Orbie Pyo, MD as PCP - Cardiology (Cardiology) Malachy Mood, MD as Consulting Physician (Hematology)   Outpatient Medications Prior to Visit  Medication Sig   amLODipine (NORVASC) 5 MG tablet Take 1 tablet (5 mg total) by mouth daily.   atorvastatin (LIPITOR) 20 MG tablet Take 1 tablet (20 mg total) by mouth daily.   gabapentin (NEURONTIN) 100 MG capsule Take 2 capsules (200 mg total) by mouth at bedtime.  No facility-administered medications prior to visit.    Review of Systems  Constitutional:  Negative for chills, fever, malaise/fatigue and weight loss.  HENT:  Positive for hearing loss and tinnitus. Negative for congestion, ear pain and sore throat.   Eyes:  Negative for blurred vision, pain and redness.  Respiratory:  Negative for cough, hemoptysis and shortness of breath.   Cardiovascular:  Negative for chest pain, palpitations, orthopnea, claudication and leg swelling.  Gastrointestinal:  Negative for abdominal pain, blood in stool, constipation, diarrhea, nausea and vomiting.  Genitourinary:  Negative for dysuria, flank pain, frequency, hematuria and urgency.  Musculoskeletal:  Negative for falls, joint pain and myalgias.  Skin:  Negative for itching and rash.  Neurological:   Negative for dizziness, tingling, speech change, weakness and headaches.  Endo/Heme/Allergies:  Negative for polydipsia. Does not bruise/bleed easily.  Psychiatric/Behavioral:  Negative for depression and memory loss. The patient is not nervous/anxious and does not have insomnia.         Objective:     BP 118/74   Pulse 72   Ht 5' (1.524 m)   Wt 123 lb 6.4 oz (56 kg)   SpO2 98%   BMI 24.10 kg/m  BP Readings from Last 3 Encounters:  02/28/24 118/74  01/26/24 122/76  12/29/22 130/86   Wt Readings from Last 3 Encounters:  02/28/24 123 lb 6.4 oz (56 kg)  01/26/24 124 lb 3.2 oz (56.3 kg)  12/29/22 136 lb (61.7 kg)      Physical Exam Vitals and nursing note reviewed.  Constitutional:      General: He is not in acute distress.    Appearance: Normal appearance. He is not ill-appearing.  HENT:     Head: Normocephalic and atraumatic.     Right Ear: External ear normal.     Left Ear: External ear normal.     Ears:     Comments: Chronically abnormal appearing TMs including scar tissue    Nose: Nose normal.     Mouth/Throat:     Mouth: Mucous membranes are moist.     Pharynx: Oropharynx is clear.  Eyes:     Extraocular Movements: Extraocular movements intact.     Conjunctiva/sclera: Conjunctivae normal.     Pupils: Pupils are equal, round, and reactive to light.  Neck:     Vascular: No carotid bruit.  Cardiovascular:     Rate and Rhythm: Normal rate and regular rhythm.     Pulses: Normal pulses.     Heart sounds: Normal heart sounds.  Pulmonary:     Effort: Pulmonary effort is normal.     Breath sounds: Normal breath sounds.  Abdominal:     General: Bowel sounds are normal. There is no distension.     Palpations: Abdomen is soft. There is no mass.     Tenderness: There is no abdominal tenderness.     Hernia: No hernia is present.  Musculoskeletal:        General: No swelling, tenderness or deformity. Normal range of motion.     Cervical back: Normal range of motion  and neck supple. No tenderness.     Right lower leg: No edema.     Left lower leg: No edema.  Lymphadenopathy:     Cervical: No cervical adenopathy.  Skin:    General: Skin is warm and dry.     Capillary Refill: Capillary refill takes less than 2 seconds.  Neurological:     General: No focal deficit present.  Mental Status: He is alert and oriented to person, place, and time. Mental status is at baseline.     Cranial Nerves: No cranial nerve deficit.     Sensory: No sensory deficit.     Motor: No weakness.     Gait: Gait normal.     Deep Tendon Reflexes: Reflexes normal.  Psychiatric:        Mood and Affect: Mood normal.        Behavior: Behavior normal.        Judgment: Judgment normal.          Assessment & Plan:    Routine Health Maintenance and Physical Exam  Immunization History  Administered Date(s) Administered   Influenza,inj,Quad PF,6+ Mos 03/07/2019, 09/17/2022   PFIZER(Purple Top)SARS-COV-2 Vaccination 07/05/2020, 07/26/2020   Pneumococcal Polysaccharide-23 09/17/2022   Tdap 03/07/2019    Health Maintenance  Topic Date Due   COVID-19 Vaccine (3 - 2024-25 season) 03/15/2024 (Originally 08/28/2023)   INFLUENZA VACCINE  03/26/2024 (Originally 07/28/2023)   Zoster Vaccines- Shingrix (1 of 2) 04/25/2024 (Originally 03/25/2016)   Pneumococcal Vaccine 59-49 Years old (2 of 2 - PCV) 01/25/2025 (Originally 09/18/2023)   Fecal DNA (Cologuard)  09/21/2024   Lung Cancer Screening  02/22/2025   DTaP/Tdap/Td (2 - Td or Tdap) 03/06/2029   Hepatitis C Screening  Completed   HIV Screening  Completed   HPV VACCINES  Aged Out    Discussed health benefits of physical activity, and encouraged him to engage in regular exercise appropriate for his age and condition.  Problem List Items Addressed This Visit     Tobacco use   I encouraged him to quit smoking completely      Conductive hearing loss, bilateral   Follow-up with audiology soon as planned      Presbycusis of  both ears   Essential hypertension, benign   Continue current medication amlodipine 5 mg daily      Mixed dyslipidemia   I reviewed his recent labs from January 2025.  Continue atorvastatin 20 mg daily      Low mean corpuscular volume (MCV)   Encounter for health maintenance examination in adult - Primary   Relevant Orders   POCT Urinalysis DIP (Proadvantage Device) (Completed)   Iron, TIBC and Ferritin Panel   Thyroid nodule   Referral for updated yearly ultrasound      Relevant Orders   US THYROID   Elevated red blood cell count   I reviewed back of his hematology notes from 2021.  They felt like his abnormal blood counts were in part due to ancestry being [place] Asian but also felt strongly his tobacco use contributed to the elevated red cells.  Updated labs today for surveillance.  I did encourage him to donate blood given the elevated red cells but he does not want to do that      Screening for colon cancer   He will do the Cologuard repeat later this year in the fall      Relevant Orders   Cologuard   Elevated ferritin   Relevant Orders   Iron, TIBC and Ferritin Panel   Other Visit Diagnoses       Teeth decayed       Relevant Orders   Ambulatory referral to Dentistry      Return for CPX physical fasting.     Kristian Covey, PA-C

## 2024-02-28 NOTE — Assessment & Plan Note (Signed)
 Continue current medication amlodipine 5 mg daily

## 2024-02-28 NOTE — Assessment & Plan Note (Signed)
 Referral for updated yearly ultrasound

## 2024-02-28 NOTE — Assessment & Plan Note (Signed)
 I reviewed his recent labs from January 2025.  Continue atorvastatin 20 mg daily

## 2024-02-29 LAB — IRON,TIBC AND FERRITIN PANEL
Ferritin: 695 ng/mL — ABNORMAL HIGH (ref 30–400)
Iron Saturation: 64 % — ABNORMAL HIGH (ref 15–55)
Iron: 151 ug/dL (ref 38–169)
Total Iron Binding Capacity: 237 ug/dL — ABNORMAL LOW (ref 250–450)
UIBC: 86 ug/dL — ABNORMAL LOW (ref 111–343)

## 2024-02-29 NOTE — Progress Notes (Signed)
 You may need an interpreter for this phone call   iron stores are okay, your ferritin level is little high.  As I mentioned yesterday given that your ferritin remains elevated and your red cells and elevated this can increase your overall blood to be a little thick  I do recommend considering donating blood occasionally or the plasma center down the street you can actually get paid to donate plasma and blood  Otherwise continue regular medications as usual

## 2024-10-05 LAB — COLOGUARD: COLOGUARD: NEGATIVE

## 2024-10-07 ENCOUNTER — Ambulatory Visit: Payer: Self-pay | Admitting: Medical

## 2024-10-07 NOTE — Progress Notes (Signed)
 Your Cologuard screening for colon cancer was negative.  This indicates a lower likelihood that colorectal cancer is present.   Lets plan to repeat this in 3 years.  However, if you develop bowel changes, blood in stool, unexpected weight loss, or other new bowel changes, then recheck.

## 2024-11-15 ENCOUNTER — Other Ambulatory Visit: Payer: Self-pay | Admitting: Medical

## 2025-01-22 ENCOUNTER — Other Ambulatory Visit: Payer: Self-pay | Admitting: Medical

## 2025-01-22 NOTE — Telephone Encounter (Signed)
 Last appt 02/28/24 next appt. 03/09/25.

## 2025-03-07 ENCOUNTER — Encounter: Payer: Self-pay | Admitting: Medical
